# Patient Record
Sex: Female | Born: 1945 | Race: Black or African American | Hispanic: No | Marital: Married | Smoking: Former smoker
Health system: Southern US, Community
[De-identification: ages and names within clinical notes are randomized; demographics above are authoritative.]

## PROBLEM LIST (undated history)

## (undated) DIAGNOSIS — I10 Essential (primary) hypertension: Secondary | ICD-10-CM

## (undated) DIAGNOSIS — K219 Gastro-esophageal reflux disease without esophagitis: Secondary | ICD-10-CM

## (undated) DIAGNOSIS — R51 Headache: Secondary | ICD-10-CM

## (undated) DIAGNOSIS — M1990 Unspecified osteoarthritis, unspecified site: Secondary | ICD-10-CM

## (undated) DIAGNOSIS — M109 Gout, unspecified: Secondary | ICD-10-CM

## (undated) DIAGNOSIS — E785 Hyperlipidemia, unspecified: Secondary | ICD-10-CM

## (undated) DIAGNOSIS — R519 Headache, unspecified: Secondary | ICD-10-CM

## (undated) DIAGNOSIS — E119 Type 2 diabetes mellitus without complications: Secondary | ICD-10-CM

## (undated) DIAGNOSIS — N189 Chronic kidney disease, unspecified: Secondary | ICD-10-CM

## (undated) HISTORY — DX: Chronic kidney disease, unspecified: N18.9

## (undated) HISTORY — PX: NO PAST SURGERIES: SHX2092

## (undated) HISTORY — PX: TUBAL LIGATION: SHX77

## (undated) HISTORY — DX: Essential (primary) hypertension: I10

## (undated) HISTORY — DX: Gout, unspecified: M10.9

## (undated) HISTORY — DX: Hyperlipidemia, unspecified: E78.5

## (undated) HISTORY — DX: Type 2 diabetes mellitus without complications: E11.9

---

## 2011-10-07 ENCOUNTER — Other Ambulatory Visit: Payer: Self-pay | Admitting: Medical

## 2015-10-24 LAB — HEMOGLOBIN A1C: HEMOGLOBIN A1C: 7.1 % — AB (ref 4.0–6.0)

## 2015-11-02 ENCOUNTER — Ambulatory Visit (INDEPENDENT_AMBULATORY_CARE_PROVIDER_SITE_OTHER): Payer: Medicare Other | Admitting: "Endocrinology

## 2015-11-02 ENCOUNTER — Encounter: Payer: Self-pay | Admitting: "Endocrinology

## 2015-11-02 VITALS — BP 129/77 | HR 86 | Ht 67.0 in | Wt 212.0 lb

## 2015-11-02 DIAGNOSIS — I10 Essential (primary) hypertension: Secondary | ICD-10-CM | POA: Insufficient documentation

## 2015-11-02 DIAGNOSIS — E1122 Type 2 diabetes mellitus with diabetic chronic kidney disease: Secondary | ICD-10-CM

## 2015-11-02 DIAGNOSIS — N181 Chronic kidney disease, stage 1: Secondary | ICD-10-CM

## 2015-11-02 DIAGNOSIS — E785 Hyperlipidemia, unspecified: Secondary | ICD-10-CM | POA: Diagnosis not present

## 2015-11-02 NOTE — Patient Instructions (Signed)

## 2015-11-02 NOTE — Progress Notes (Signed)
Subjective:    Patient ID: Anita Lewis, female    DOB: 1946/11/24,    Past Medical History  Diagnosis Date  . Diabetes mellitus, type II (HCC)   . Hypertension   . Gout    History reviewed. No pertinent past surgical history. Social History   Social History  . Marital Status: Married    Spouse Name: N/A  . Number of Children: N/A  . Years of Education: N/A   Social History Main Topics  . Smoking status: Former Games developer  . Smokeless tobacco: Never Used  . Alcohol Use: No  . Drug Use: No  . Sexual Activity: Not Asked   Other Topics Concern  . None   Social History Narrative  . None   Outpatient Encounter Prescriptions as of 11/02/2015  Medication Sig  . aspirin 81 MG tablet Take 81 mg by mouth daily.  . insulin glargine (LANTUS) 100 UNIT/ML injection Inject 30 Units into the skin at bedtime.  Marland Kitchen lisinopril-hydrochlorothiazide (PRINZIDE,ZESTORETIC) 10-12.5 MG tablet Take 1 tablet by mouth daily.  . metFORMIN (GLUCOPHAGE) 500 MG tablet Take by mouth 2 (two) times daily with a meal.  . metoprolol tartrate (LOPRESSOR) 25 MG tablet Take 25 mg by mouth 2 (two) times daily.  . Multiple Vitamin (MULTIVITAMIN) tablet Take 1 tablet by mouth daily.   No facility-administered encounter medications on file as of 11/02/2015.   ALLERGIES: No Known Allergies VACCINATION STATUS:  There is no immunization history on file for this patient.  Diabetes She presents for her follow-up diabetic visit. She has type 2 diabetes mellitus. Onset time: She was diagnosed at approximate age of 74 years. Her disease course has been improving. There are no hypoglycemic associated symptoms. Pertinent negatives for hypoglycemia include no confusion, headaches, pallor or seizures. There are no diabetic associated symptoms. Pertinent negatives for diabetes include no chest pain, no fatigue, no polydipsia, no polyphagia and no polyuria. There are no hypoglycemic complications. Symptoms are improving.  There are no diabetic complications. Risk factors for coronary artery disease include diabetes mellitus and hypertension. She is compliant with treatment most of the time. Her weight is stable. She is following a diabetic diet. She has had a previous visit with a dietitian. She participates in exercise intermittently. Her home blood glucose trend is decreasing steadily. Her overall blood glucose range is 140-180 mg/dl. An ACE inhibitor/angiotensin II receptor blocker is being taken.  Hypertension This is a chronic problem. The current episode started more than 1 year ago. The problem is controlled. Pertinent negatives include no chest pain, headaches, palpitations or shortness of breath. Past treatments include ACE inhibitors. The current treatment provides moderate improvement. There are no compliance problems.   Hyperlipidemia This is a chronic problem. The current episode started more than 1 year ago. Pertinent negatives include no chest pain, myalgias or shortness of breath. Current antihyperlipidemic treatment includes statins.     Review of Systems  Constitutional: Negative for fatigue and unexpected weight change.  HENT: Negative for trouble swallowing and voice change.   Eyes: Negative for visual disturbance.  Respiratory: Negative for cough, shortness of breath and wheezing.   Cardiovascular: Negative for chest pain, palpitations and leg swelling.  Gastrointestinal: Negative for nausea, vomiting and diarrhea.  Endocrine: Negative for cold intolerance, heat intolerance, polydipsia, polyphagia and polyuria.  Musculoskeletal: Negative for myalgias and arthralgias.  Skin: Negative for color change, pallor, rash and wound.  Neurological: Negative for seizures and headaches.  Psychiatric/Behavioral: Negative for suicidal ideas and confusion.  Objective:    BP 129/77 mmHg  Pulse 86  Ht 5\' 7"  (1.702 m)  Wt 212 lb (96.163 kg)  BMI 33.20 kg/m2  SpO2 98%  Wt Readings from Last 3  Encounters:  11/02/15 212 lb (96.163 kg)    Physical Exam  Constitutional: She is oriented to person, place, and time. She appears well-developed.  HENT:  Head: Normocephalic and atraumatic.  Eyes: EOM are normal.  Neck: Normal range of motion. Neck supple. No tracheal deviation present. No thyromegaly present.  Cardiovascular: Normal rate and regular rhythm.   Pulmonary/Chest: Effort normal and breath sounds normal.  Abdominal: Soft. Bowel sounds are normal. There is no tenderness. There is no guarding.  Musculoskeletal: Normal range of motion. She exhibits no edema.  Neurological: She is alert and oriented to person, place, and time. She has normal reflexes. No cranial nerve deficit. Coordination normal.  Skin: Skin is warm and dry. No rash noted. No erythema. No pallor.  Psychiatric: She has a normal mood and affect. Judgment normal.       Assessment & Plan:   1. 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 These are all discussed in detail with the patient.  Patient came with improved glucose profile, and  recent A1c of 7.1 %.  Glucose logs and insulin administration records pertaining to this visit,  to be scanned into patient's records.  Recent labs reviewed.   - I have re-counseled the patient on diet management and weight loss  by adopting a carbohydrate restricted / protein rich  Diet.  - Suggestion is made for patient to avoid simple carbohydrates   from their diet including Cakes , Desserts, Ice Cream,  Soda (  diet and regular) , Sweet Tea , Candies,  Chips, Cookies, Artificial Sweeteners,   and "Sugar-free" Products .  This will help patient to have stable blood glucose profile and potentially avoid unintended  Weight gain.  - Patient is advised to stick to a routine mealtimes to eat 3 meals  a day and avoid  unnecessary snacks ( to snack only to correct hypoglycemia).  - The patient  has been  scheduled with 13/03/16, RDN, CDE for individualized DM education.  - I have approached patient with the following individualized plan to manage diabetes and patient agrees.  - I will proceed with basal insulin Lantus 30 units QHS,  associated with strict monitoring of glucose  Every morning. -Based on her readings she would not need prandial insulin for now.  - Patient is warned not to take insulin without proper monitoring per orders.  -Patient is encouraged to call clinic for blood glucose levels less than 70 or above 300 mg /dl. - I will continMetformin 500 mg by mouth twice a dayerapeutically suitable for patient..  - Patient will be considered for incretin therapy as appropriate next visit. - Patient specific target  for A1c; LDL, HDL, Triglycerides, and  Waist Circumference were discussed in detail.  2) BP/HTN: controllednue current medications including  ACEI/ARB. 3) Lipids/HPL:  continue statins. 4)  Weight/Diet: CDE consult in progress, exercise, and carbohydrates information provided.  5) Chronic Care/Health Maintenance:  -Patient is on ACEI/ARB and Statin medications and encouraged to continue to follow up with Ophthalmology, Podiatrist at least yearly or according to recommendations, and advised to  stay away from smoking. I have recommended yearly flu  vaccine and pneumonia vaccination at least every 5 years; moderate intensity exercise for up to 150 minutes weekly; and  sleep for at least 7 hours a day.  I advised patient to maintain close follow up with their PCP for primary care needs.  Patient is asked to bring meter and  blood glucose logs during their next visit.   Follow up plan: Return in about 3 months (around 02/02/2016) for diabetes, high blood pressure, high cholesterol.  Marquis Lunch, MD Phone: [phone]  Fax: [phone]   11/02/2015, 9:28 AM

## 2016-01-26 LAB — HEMOGLOBIN A1C: Hemoglobin A1C: 6.6

## 2016-02-08 ENCOUNTER — Ambulatory Visit (INDEPENDENT_AMBULATORY_CARE_PROVIDER_SITE_OTHER): Payer: Medicare Other | Admitting: "Endocrinology

## 2016-02-08 ENCOUNTER — Encounter: Payer: Self-pay | Admitting: "Endocrinology

## 2016-02-08 VITALS — BP 129/71 | HR 96 | Ht 67.0 in | Wt 211.0 lb

## 2016-02-08 DIAGNOSIS — E1122 Type 2 diabetes mellitus with diabetic chronic kidney disease: Secondary | ICD-10-CM

## 2016-02-08 DIAGNOSIS — N181 Chronic kidney disease, stage 1: Secondary | ICD-10-CM | POA: Diagnosis not present

## 2016-02-08 DIAGNOSIS — E785 Hyperlipidemia, unspecified: Secondary | ICD-10-CM | POA: Diagnosis not present

## 2016-02-08 DIAGNOSIS — I10 Essential (primary) hypertension: Secondary | ICD-10-CM | POA: Diagnosis not present

## 2016-02-08 MED ORDER — METFORMIN HCL 500 MG PO TABS: 500.0000 mg | ORAL_TABLET | Freq: Two times a day (BID) | ORAL | Status: DC

## 2016-02-08 NOTE — Patient Instructions (Signed)

## 2016-02-08 NOTE — Progress Notes (Signed)
Subjective:    Patient ID: Anita Lewis, female    DOB: 11-16-1946,    Past Medical History  Diagnosis Date  . Diabetes mellitus, type II (HCC)   . Hypertension   . Gout    History reviewed. No pertinent past surgical history. Social History   Social History  . Marital Status: Married    Spouse Name: N/A  . Number of Children: N/A  . Years of Education: N/A   Social History Main Topics  . Smoking status: Former Games developer  . Smokeless tobacco: Never Used  . Alcohol Use: No  . Drug Use: No  . Sexual Activity: Not Asked   Other Topics Concern  . None   Social History Narrative   Outpatient Encounter Prescriptions as of 02/08/2016  Medication Sig  . aspirin 81 MG tablet Take 81 mg by mouth daily.  . Insulin Glargine (LANTUS SOLOSTAR) 100 UNIT/ML Solostar Pen Inject 15 Units into the skin at bedtime.  Marland Kitchen lisinopril-hydrochlorothiazide (PRINZIDE,ZESTORETIC) 10-12.5 MG tablet Take 1 tablet by mouth daily.  . metFORMIN (GLUCOPHAGE) 500 MG tablet Take 1 tablet (500 mg total) by mouth 2 (two) times daily with a meal.  . metoprolol tartrate (LOPRESSOR) 25 MG tablet Take 25 mg by mouth 2 (two) times daily.  . Multiple Vitamin (MULTIVITAMIN) tablet Take 1 tablet by mouth daily.  . pravastatin (PRAVACHOL) 40 MG tablet Take 40 mg by mouth daily.  . [DISCONTINUED] metFORMIN (GLUCOPHAGE) 500 MG tablet Take by mouth 2 (two) times daily with a meal.  . [DISCONTINUED] insulin glargine (LANTUS) 100 UNIT/ML injection Inject 30 Units into the skin at bedtime.   No facility-administered encounter medications on file as of 02/08/2016.   ALLERGIES: No Known Allergies VACCINATION STATUS:  There is no immunization history on file for this patient.  Diabetes She presents for her follow-up diabetic visit. She has type 2 diabetes mellitus. Onset time: She was diagnosed at approximate age of 74 years. Her disease course has been improving. There are no hypoglycemic associated symptoms. Pertinent  negatives for hypoglycemia include no confusion, headaches, pallor or seizures. There are no diabetic associated symptoms. Pertinent negatives for diabetes include no chest pain, no fatigue, no polydipsia, no polyphagia and no polyuria. There are no hypoglycemic complications. Symptoms are improving. There are no diabetic complications. Risk factors for coronary artery disease include diabetes mellitus and hypertension. She is compliant with treatment most of the time. Her weight is stable. She is following a diabetic diet. She has had a previous visit with a dietitian. She participates in exercise intermittently. Her home blood glucose trend is decreasing steadily. Her breakfast blood glucose range is generally 140-180 mg/dl. Her overall blood glucose range is 140-180 mg/dl. An ACE inhibitor/angiotensin II receptor blocker is being taken.  Hypertension This is a chronic problem. The current episode started more than 1 year ago. The problem is controlled. Pertinent negatives include no chest pain, headaches, palpitations or shortness of breath. Past treatments include ACE inhibitors. The current treatment provides moderate improvement. There are no compliance problems.   Hyperlipidemia This is a chronic problem. The current episode started more than 1 year ago. Pertinent negatives include no chest pain, myalgias or shortness of breath. Current antihyperlipidemic treatment includes statins.     Review of Systems  Constitutional: Negative for fatigue and unexpected weight change.  HENT: Negative for trouble swallowing and voice change.   Eyes: Negative for visual disturbance.  Respiratory: Negative for cough, shortness of breath and wheezing.   Cardiovascular:  Negative for chest pain, palpitations and leg swelling.  Gastrointestinal: Negative for nausea, vomiting and diarrhea.  Endocrine: Negative for cold intolerance, heat intolerance, polydipsia, polyphagia and polyuria.  Musculoskeletal: Negative  for myalgias and arthralgias.  Skin: Negative for color change, pallor, rash and wound.  Neurological: Negative for seizures and headaches.  Psychiatric/Behavioral: Negative for suicidal ideas and confusion.    Objective:    BP 129/71 mmHg  Pulse 96  Ht 5\' 7"  (1.702 m)  Wt 211 lb (95.709 kg)  BMI 33.04 kg/m2  SpO2 96%  Wt Readings from Last 3 Encounters:  02/08/16 211 lb (95.709 kg)  11/02/15 212 lb (96.163 kg)    Physical Exam  Constitutional: She is oriented to person, place, and time. She appears well-developed.  HENT:  Head: Normocephalic and atraumatic.  Eyes: EOM are normal.  Neck: Normal range of motion. Neck supple. No tracheal deviation present. No thyromegaly present.  Cardiovascular: Normal rate and regular rhythm.   Pulmonary/Chest: Effort normal and breath sounds normal.  Abdominal: Soft. Bowel sounds are normal. There is no tenderness. There is no guarding.  Musculoskeletal: Normal range of motion. She exhibits no edema.  Neurological: She is alert and oriented to person, place, and time. She has normal reflexes. No cranial nerve deficit. Coordination normal.  Skin: Skin is warm and dry. No rash noted. No erythema. No pallor.  Psychiatric: She has a normal mood and affect. Judgment normal.   Recent Results (from the past 2160 hour(s))  Hemoglobin A1c     Status: None   Collection Time: 01/26/16 12:00 AM  Result Value Ref Range   Hemoglobin A1C 6.6      Assessment & Plan:   1. 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 These are all discussed in detail with the patient.  Patient came with improved glucose profile, and  recent A1c improved to 6.6% from 7.1 %.  Glucose logs and insulin administration records pertaining to this visit,  to be scanned into patient's records.  Recent labs reviewed.   - I  have re-counseled the patient on diet management and weight loss  by adopting a carbohydrate restricted / protein rich  Diet.  - Suggestion is made for patient to avoid simple carbohydrates   from their diet including Cakes , Desserts, Ice Cream,  Soda (  diet and regular) , Sweet Tea , Candies,  Chips, Cookies, Artificial Sweeteners,   and "Sugar-free" Products .  This will help patient to have stable blood glucose profile and potentially avoid unintended  Weight gain.  - Patient is advised to stick to a routine mealtimes to eat 3 meals  a day and avoid unnecessary snacks ( to snack only to correct hypoglycemia).  - The patient  has been  scheduled with 01/28/16, RDN, CDE for individualized DM education.  - I have approached patient with the following individualized plan to manage diabetes and patient agrees.  - I will lower Lantus   To 15 units QHS,  associated with strict monitoring of glucose  Every morning. -Based on her readings she would not need prandial insulin for now.   -Patient is encouraged to call clinic for blood glucose levels less than 70 or above 300 mg /dl. - I will continue Metformin 500 mg by mouth twice a dayerapeutically suitable for patient..  - Patient will be considered  for incretin therapy as appropriate next visit. - Patient specific target  for A1c; LDL, HDL, Triglycerides, and  Waist Circumference were discussed in detail.  2) BP/HTN: controllednue current medications including  ACEI/ARB. 3) Lipids/HPL:  continue statins. 4)  Weight/Diet: CDE consult in progress, exercise, and carbohydrates information provided.  5) Chronic Care/Health Maintenance:  -Patient is on ACEI/ARB and Statin medications and encouraged to continue to follow up with Ophthalmology, Podiatrist at least yearly or according to recommendations, and advised to  stay away from smoking. I have recommended yearly flu vaccine and pneumonia vaccination at least every 5 years; moderate  intensity exercise for up to 150 minutes weekly; and  sleep for at least 7 hours a day.  I advised patient to maintain close follow up with their PCP for primary care needs.  Patient is asked to bring meter and  blood glucose logs during their next visit.   Follow up plan: Return in about 3 months (around 05/07/2016) for diabetes, high blood pressure, high cholesterol, follow up with pre-visit labs, meter, and logs.  Marquis Lunch, MD Phone: [phone]  Fax: [phone]   02/08/2016, 9:33 AM

## 2016-02-16 ENCOUNTER — Other Ambulatory Visit: Payer: Self-pay | Admitting: "Endocrinology

## 2016-03-14 ENCOUNTER — Encounter: Payer: Medicare Other | Attending: "Endocrinology | Admitting: Nutrition

## 2016-03-14 ENCOUNTER — Encounter: Payer: Self-pay | Admitting: Nutrition

## 2016-03-14 VITALS — Ht 66.0 in | Wt 209.0 lb

## 2016-03-14 DIAGNOSIS — N181 Chronic kidney disease, stage 1: Secondary | ICD-10-CM | POA: Diagnosis not present

## 2016-03-14 DIAGNOSIS — E119 Type 2 diabetes mellitus without complications: Secondary | ICD-10-CM

## 2016-03-14 DIAGNOSIS — E1122 Type 2 diabetes mellitus with diabetic chronic kidney disease: Secondary | ICD-10-CM | POA: Diagnosis not present

## 2016-03-14 DIAGNOSIS — E669 Obesity, unspecified: Secondary | ICD-10-CM

## 2016-03-14 NOTE — Progress Notes (Signed)
  Medical Nutrition Therapy:  Appt start time: 0830 end time:  0930.  Assessment:  Primary concerns today: Diabetes Type 2. Most recent A1C 6.6%, down from 7.1%. 15 units Lantus, 500 mg of Metform twice a day. She cooks and shops. Eats 2-3 meals per day. Tends to skip meals often. Physical activity: housework but not exercising much. A1C improving. Denies any low blood sugars.  Diet is inconsistent in CHO, fat, protein and low in fresh fruits and low carb vegetables. Needs to cut out sweetened beverages and sherbet.. Seems to be motivated.  Preferred Learning Style:  Auditory  Visual  Hands on   Learning Readiness:   Ready  Change in progress   MEDICATIONS: See list   DIETARY INTAKE:   24-hr recall:  B ( AM): 1 boiled egg, 1 slice, coffee; sometimes skips Snk ( AM):  none  L ( PM): skipped , water Snk ( PM):  D ( PM): Chicken, potatoes, Koolaid, Lemonade not sugar free Snk ( PM): Sherbet Beverages: water, koolaild, lemonade  Usual physical activity: ADL  Estimated energy needs: 1500 calories 170 g carbohydrates 112 g protein 42 g fat  Progress Towards Goal(s):  In progress.   Nutritional Diagnosis:  NB-1.1 Food and nutrition-related knowledge deficit As related to DM.  As evidenced by A1`C 6.6%.    Intervention:  Nutrition and Diabetes education provided on My Plate, CHO counting, meal planning, portion sizes, timing of meals, avoiding snacks between meals unless having a low blood sugar, target ranges for A1C and blood sugars, signs/symptoms and treatment of hyper/hypoglycemia, monitoring blood sugars, taking medications as prescribed, benefits of exercising 30 minutes per day and prevention of complications of DM.  Goals: 1 Follow Plate Method 2. Cut out sherbet. Do not skip meals. 3. Increase low carb vegetables and fresh fruit. 4. Drink only water-no juice, soda, tea or smoothies 5. Exercise 30+ minutes 5 days per week. 6. Lose 1-2 lbs per week. 7. Only  eat vegetables for snacks. 8. Do not eat past 7 pm.  Teaching Method Utilized:  Visual Auditory Hands on  Handouts given during visit include:  The Plate Method  Meal Plan Card    Barriers to learning/adherence to lifestyle change: None  Demonstrated degree of understanding via:  Teach Back   Monitoring/Evaluation:  Dietary intake, exercise, meal planning, SBG, and body weight in 1 month(s).

## 2016-03-15 NOTE — Patient Instructions (Signed)
Goals: 1 Follow Plate Method 2. Cut out sherbet. 3. Increase low carb vegetables and fresh fruit. 4. Drink only water-no juice, soda, tea or smoothies 5. Exercise 30+ minutes 5 days per week. 6. Lose 1-2 lbs per week. 7. Only eat vegetables for snacks. 8. Do not eat past 7 pm.

## 2016-05-09 ENCOUNTER — Ambulatory Visit: Payer: Medicare Other | Admitting: Nutrition

## 2016-05-09 ENCOUNTER — Ambulatory Visit: Payer: Medicare Other | Admitting: "Endocrinology

## 2016-12-12 ENCOUNTER — Ambulatory Visit (INDEPENDENT_AMBULATORY_CARE_PROVIDER_SITE_OTHER): Payer: Medicare Other | Admitting: Orthopaedic Surgery

## 2016-12-19 ENCOUNTER — Ambulatory Visit (INDEPENDENT_AMBULATORY_CARE_PROVIDER_SITE_OTHER): Payer: Medicare Other

## 2016-12-19 ENCOUNTER — Encounter (INDEPENDENT_AMBULATORY_CARE_PROVIDER_SITE_OTHER): Payer: Self-pay | Admitting: Orthopaedic Surgery

## 2016-12-19 ENCOUNTER — Ambulatory Visit (INDEPENDENT_AMBULATORY_CARE_PROVIDER_SITE_OTHER): Payer: Medicare Other | Admitting: Orthopaedic Surgery

## 2016-12-19 VITALS — BP 121/72 | HR 80 | Ht 67.0 in | Wt 210.0 lb

## 2016-12-19 DIAGNOSIS — M1611 Unilateral primary osteoarthritis, right hip: Secondary | ICD-10-CM | POA: Insufficient documentation

## 2016-12-19 DIAGNOSIS — M5441 Lumbago with sciatica, right side: Secondary | ICD-10-CM

## 2016-12-19 DIAGNOSIS — M25551 Pain in right hip: Secondary | ICD-10-CM

## 2016-12-19 NOTE — Progress Notes (Signed)
Office Visit Note   Patient: Anita Lewis           Date of Birth: 10-27-1946           MRN: [id number] Visit Date: 12/19/2016              Requested by: Oley Balm. Margo Common, MD [address] [place], [place] [address] PCP: Louie Boston, MD   Assessment & Plan: Visit Diagnoses:  1. Primary osteoarthritis of right hip   2. Right-sided low back pain with right-sided sciatica, unspecified chronicity   3. Unilateral primary osteoarthritis, right hip     Plan: Patient has complete loss of joint space end-stage hip osteoarthritis. Several years ago she was told she needed total hip arthroplasty and has been discussed with her previously for her primary care physician Dr. Margo Common. We discussed using the direct anterior approach 1-2 night stay in the hospital ,immediately weightbearing after surgery as the usual protocol. Perioperative diabetic control discussed. A1c has been less than 7. Procedure discussed risks of surgery discussed. She can call if she would like to proceed with scheduling.  Follow-Up Instructions: No Follow-up on file.   Orders:  Orders Placed This Encounter  Procedures  . XR HIP UNILAT W OR W/O PELVIS 2-3 VIEWS RIGHT  . XR Lumbar Spine 2-3 Views   No orders of the defined types were placed in this encounter.     Procedures: No procedures performed   Clinical Data: No additional findings.   Subjective: Chief Complaint  Patient presents with  . Lower Back - Pain  . Right Leg - Pain    Patient presents with low back pain that radiates into the right hip and thigh.  She has had ESI 11/2015.  She has a history of disc bulges with a MRI being done 2015.  She states the pain has been ongoing x 2 years. It gets better, but then it gets worse again. She is taking Tylenol as needed for pain with no relief.  Patient relates that she has pain with weightbearing. When she walks she notices that she lurches to the right. Pain is in the anterior right groin radiates  down to the mid thigh. She gets relief with supine position. At the end of the day she has more pain difficulty sleeping. She has used a cane. She does not have a walker. She has taken aspirin and also Tylenol for pain without relief. She denies any pain that radiates down past the mid thigh region. No numbness or tingling or weakness in her foot. She is on Lantus insulin 15 units at night and also takes metformin.  Review of Systems  Constitutional: Negative for chills and diaphoresis.  HENT: Negative for ear discharge, ear pain and nosebleeds.   Eyes: Negative for discharge and visual disturbance.  Respiratory: Negative for cough, choking and shortness of breath.   Cardiovascular: Negative for chest pain and palpitations.  Gastrointestinal: Negative for abdominal distention and abdominal pain.  Endocrine: Negative for cold intolerance and heat intolerance.       Positive for type 2 diabetes on Lantus insulin and metformin  Genitourinary: Negative for flank pain and hematuria.  Musculoskeletal:       Previous trigger finger surgery last year doing well. Past history of slight L5-S1 disc bulge without severe compression. She's had epidural steroid injections and previous years. L5-S1 disc space narrowing on plain radiographs. Hip x-rays demonstrate progressive hip arthritis now severe.  Skin: Negative for rash and wound.  Neurological: Negative for seizures and speech difficulty.  Hematological: Negative for adenopathy. Does not bruise/bleed easily.  Psychiatric/Behavioral: Negative for agitation and suicidal ideas.     Objective: Vital Signs: BP 121/72   Pulse 80   Ht 5\' 7"  (1.702 m)   Wt 210 lb (95.3 kg)   BMI 32.89 kg/m   Physical Exam  Constitutional: She is oriented to person, place, and time. She appears well-developed and well-nourished.  HENT:  Head: Normocephalic.  Right Ear: External ear normal.  Left Ear: External ear normal.  Eyes: Pupils are equal, round, and reactive  to light.  Neck: No tracheal deviation present. No thyromegaly present.  Cardiovascular: Normal rate.   Pulmonary/Chest: Effort normal.  Abdominal: Soft. She exhibits no distension. There is no tenderness.  Musculoskeletal:  Patient cannot figure for the right side can do it easily on the left. Internal rotation of the right hip is only 10 with severe groin pain reproducing her pain with weightbearing. Positive Trendelenburg gait on the right negative on the left. Negative straight leg raising 90 knee and ankle jerk are 2+ anterior tib EHL are strong quad strength is good no adductor or abductor weakness.  Neurological: She is alert and oriented to person, place, and time.  Skin: Skin is warm and dry.  Psychiatric: She has a normal mood and affect. Her behavior is normal.    Ortho Exam patient does not have hip flexion contracture. Positive logroll on the right only in both the flexion and extension of the hip. Leg lengths are equal bilateral trochanteric bursal tenderness mild sciatic notch tenderness. Negative reverse straight leg raising. Anterior tib EHL gastrocsoleus are all normal to motor testing. Distal pulses are intact.  Specialty Comments:  No specialty comments available.  Imaging: Xr Hip Unilat W Or W/o Pelvis 2-3 Views Right  Result Date: 12/19/2016 Standing AP pelvis and AP and frog-leg x-ray right hip are obtained and reviewed. This shows unilateral hip osteoarthritis of the right hip loss of joint space and flattening of the head marginal osteophytes subchondral sclerosis and subchondral cyst formation. Impression: End-stage right hip osteoarthritis  Xr Lumbar Spine 2-3 Views  Result Date: 12/19/2016 AP and lateral lumbar x-rays obtained. Lumbar spine film shows slight calcification of the of the abdominal aorta. 80% loss of disc space height at L5-S1. Facet degenerative changes seen at L5-S1 on AP x-ray and severe right hip osteoarthritis. Impression L5-S1 disks  narrowing without spondylolisthesis.    PMFS History: Patient Active Problem List   Diagnosis Date Noted  . Unilateral primary osteoarthritis, right hip 12/19/2016  . 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 Essential hypertension, benign 11/02/2015  . Hyperlipidemia 11/02/2015  . Morbid obesity due to excess calories (HCC) 11/02/2015   Past Medical History:  Diagnosis Date  . Diabetes mellitus, type II (HCC)   . Gout   . Hypertension     Family History  Problem Relation Age of Onset  . Cancer Mother     lung  . Hypertension Mother   . Hypertension Father     No past surgical history on file. Social History   Occupational History  . Not on file.   Social History Main Topics  . Smoking status: Former 13/02/2015  . Smokeless tobacco: Never Used  . Alcohol use No  . Drug use: No  . Sexual activity: Not on file

## 2017-01-24 ENCOUNTER — Other Ambulatory Visit (INDEPENDENT_AMBULATORY_CARE_PROVIDER_SITE_OTHER): Payer: Self-pay | Admitting: Orthopaedic Surgery

## 2017-01-24 DIAGNOSIS — M1611 Unilateral primary osteoarthritis, right hip: Secondary | ICD-10-CM

## 2017-02-07 NOTE — Pre-Procedure Instructions (Signed)
HYDEIA MCATEE  02/07/2017      [facility] [address], [place] - [address] [place] [address] Phone: [phone] Fax: [phone]  [facility] - [place], [place] - [address] [place] [address] Phone: [phone] Fax: [phone]    Your procedure is scheduled on 02/17/17.  Report to [facility] Admitting at 1030 A.M.  Call this number if you have problems the morning of surgery:  [phone]   Remember:  Do not eat food or drink liquids after midnight.  Take these medicines the morning of surgery with A SIP OF WATER --tylenol,metoprolol   Do not wear jewelry, make-up or nail polish.  Do not wear lotions, powders, or perfumes, or deoderant.  Do not shave 48 hours prior to surgery.  Men may shave face and neck.  Do not bring valuables to the hospital.  [facility] is not responsible for any belongings or valuables.  Contacts, dentures or bridgework may not be worn into surgery.  Leave your suitcase in the car.  After surgery it may be brought to your room.  For patients admitted to the hospital, discharge time will be determined by your treatment team.  Patients discharged the day of surgery will not be allowed to drive home.   Name and phone number of your driver:    Special instructions:  Do not take any aspirin,anti-inflammatories,vitamins,or herbal supplements 5-7 days prior to surgery.  Please read over the following fact sheets that you were given. MRSA Information    How to Manage Your Diabetes Before and After Surgery  Why is it important to control my blood sugar before and after surgery? . Improving blood sugar levels before and after surgery helps healing and can limit problems. . A way of improving blood sugar control is eating a healthy diet by: o  Eating less sugar and carbohydrates o  Increasing activity/exercise o  Talking with your doctor about reaching your blood  sugar goals . High blood sugars (greater than 180 mg/dL) can raise your risk of infections and slow your recovery, so you will need to focus on controlling your diabetes during the weeks before surgery. . Make sure that the doctor who takes care of your diabetes knows about your planned surgery including the date and location.  How do I manage my blood sugar before surgery? . Check your blood sugar at least 4 times a day, starting 2 days before surgery, to make sure that the level is not too high or low. o Check your blood sugar the morning of your surgery when you wake up and every 2 hours until you get to the Short Stay unit. . If your blood sugar is less than 70 mg/dL, you will need to treat for low blood sugar: o Do not take insulin. o Treat a low blood sugar (less than 70 mg/dL) with  cup of clear juice (cranberry or apple), 4 glucose tablets, OR glucose gel. o Recheck blood sugar in 15 minutes after treatment (to make sure it is greater than 70 mg/dL). If your blood sugar is not greater than 70 mg/dL on recheck, call [phone] for further instructions. . Report your blood sugar to the short stay nurse when you get to Short Stay.  . If you are admitted to the hospital after surgery: o Your blood sugar will be checked by the staff and you will probably be given insulin after  surgery (instead of oral diabetes medicines) to make sure you have good blood sugar levels. o The goal for blood sugar control after surgery is 80-180 mg/dL.              WHAT DO I DO ABOUT MY DIABETES MEDICATION?   Marland Kitchen Do not take oral diabetes medicines (pills) the morning of surgery.  . THE NIGHT BEFORE SURGERY, take ___________ units of ___________insulin.       Marland Kitchen HE MORNING OF SURGERY, take _____________ units of __________insulin.  . The day of surgery, do not take other diabetes injectables, including Byetta (exenatide), Bydureon (exenatide ER), Victoza (liraglutide), or Trulicity  (dulaglutide).  . If your CBG is greater than 220 mg/dL, you may take  of your sliding scale (correction) dose of insulin.  Other Instructions:          Patient Signature:  Date:   Nurse Signature:  Date:   Reviewed and Endorsed by [facility] Patient Education Committee, August 2015

## 2017-02-10 ENCOUNTER — Encounter (HOSPITAL_COMMUNITY): Payer: Self-pay

## 2017-02-10 ENCOUNTER — Other Ambulatory Visit (HOSPITAL_COMMUNITY): Payer: PRIVATE HEALTH INSURANCE

## 2017-02-10 ENCOUNTER — Encounter (HOSPITAL_COMMUNITY)
Admission: RE | Admit: 2017-02-10 | Discharge: 2017-02-10 | Disposition: A | Discharge status: Home / Self Care | Payer: Medicare Other | Source: Ambulatory Visit | Attending: Orthopaedic Surgery | Admitting: Orthopaedic Surgery

## 2017-02-10 DIAGNOSIS — I10 Essential (primary) hypertension: Secondary | ICD-10-CM | POA: Insufficient documentation

## 2017-02-10 DIAGNOSIS — Z01810 Encounter for preprocedural cardiovascular examination: Secondary | ICD-10-CM | POA: Diagnosis present

## 2017-02-10 DIAGNOSIS — Z01812 Encounter for preprocedural laboratory examination: Secondary | ICD-10-CM | POA: Insufficient documentation

## 2017-02-10 DIAGNOSIS — E119 Type 2 diabetes mellitus without complications: Secondary | ICD-10-CM | POA: Diagnosis not present

## 2017-02-10 HISTORY — DX: Headache: R51

## 2017-02-10 HISTORY — DX: Unspecified osteoarthritis, unspecified site: M19.90

## 2017-02-10 HISTORY — DX: Headache, unspecified: R51.9

## 2017-02-10 LAB — URINALYSIS, ROUTINE W REFLEX MICROSCOPIC
BILIRUBIN URINE: NEGATIVE
Glucose, UA: NEGATIVE mg/dL
Hgb urine dipstick: NEGATIVE
KETONES UR: NEGATIVE mg/dL
NITRITE: NEGATIVE
Protein, ur: NEGATIVE mg/dL
SPECIFIC GRAVITY, URINE: 1.012 (ref 1.005–1.030)
pH: 7 (ref 5.0–8.0)

## 2017-02-10 LAB — COMPREHENSIVE METABOLIC PANEL
ALBUMIN: 3.6 g/dL (ref 3.5–5.0)
ALT: 7 U/L — AB (ref 14–54)
AST: 14 U/L — ABNORMAL LOW (ref 15–41)
Alkaline Phosphatase: 56 U/L (ref 38–126)
Anion gap: 9 (ref 5–15)
BUN: 20 mg/dL (ref 6–20)
CHLORIDE: 106 mmol/L (ref 101–111)
CO2: 25 mmol/L (ref 22–32)
CREATININE: 1.34 mg/dL — AB (ref 0.44–1.00)
Calcium: 9.4 mg/dL (ref 8.9–10.3)
GFR calc non Af Amer: 39 mL/min — ABNORMAL LOW (ref >60)
GFR, EST AFRICAN AMERICAN: 46 mL/min — AB (ref >60)
GLUCOSE: 102 mg/dL — AB (ref 65–99)
Potassium: 4 mmol/L (ref 3.5–5.1)
Sodium: 140 mmol/L (ref 135–145)
Total Bilirubin: 0.8 mg/dL (ref 0.3–1.2)
Total Protein: 7.2 g/dL (ref 6.5–8.1)

## 2017-02-10 LAB — CBC
HCT: 35.6 % — ABNORMAL LOW (ref 36.0–46.0)
HEMOGLOBIN: 10.8 g/dL — AB (ref 12.0–15.0)
MCH: 24.9 pg — AB (ref 26.0–34.0)
MCHC: 30.3 g/dL (ref 30.0–36.0)
MCV: 82 fL (ref 78.0–100.0)
PLATELETS: 268 10*3/uL (ref 150–400)
RBC: 4.34 MIL/uL (ref 3.87–5.11)
RDW: 16.5 % — ABNORMAL HIGH (ref 11.5–15.5)
WBC: 9.3 10*3/uL (ref 4.0–10.5)

## 2017-02-10 LAB — SURGICAL PCR SCREEN
MRSA, PCR: NEGATIVE
STAPHYLOCOCCUS AUREUS: NEGATIVE

## 2017-02-10 LAB — GLUCOSE, CAPILLARY: GLUCOSE-CAPILLARY: 90 mg/dL (ref 65–99)

## 2017-02-11 ENCOUNTER — Other Ambulatory Visit (INDEPENDENT_AMBULATORY_CARE_PROVIDER_SITE_OTHER): Payer: Self-pay | Admitting: Orthopaedic Surgery

## 2017-02-11 LAB — HEMOGLOBIN A1C
HEMOGLOBIN A1C: 6.8 % — AB (ref 4.8–5.6)
MEAN PLASMA GLUCOSE: 148 mg/dL

## 2017-02-11 NOTE — Progress Notes (Signed)
Anesthesia Chart Review: Patient is a 71 year old female scheduled for right THA on 02/17/17 by Dr. Ophelia Charter.  History includes former smoker, diabetes mellitus type 2, hypertension, gout, arthritis, headaches, tubal ligation. BMI is consistent with obesity.  PCP is Dr. Wyvonnia Lora at [facility]. Endocrinologist is Dr. Fransico Him.  BP (!) 158/81   Pulse 91   Temp 36.5 C   Resp 20   Ht 5\' 7"  (1.702 m)   Wt 204 lb 11.2 oz (92.9 kg)   SpO2 100%   BMI 32.06 kg/m   Meds include aspirin 81 mg, vitamin D, Lantus, lisinopril-HCTZ, metformin, Lopressor.  EKG to 1218: Normal sinus rhythm, cannot rule out anterior infarct, age undetermined. Nonspecific ST-T wave changes. No old tracing to compare in [place] or [place].  Preoperative labs noted. Creatinine 1.34 (BUN 24, Cr 1.24 on 10/24/15; scanned under Media tab from [facility] [place]). H&H 10.8 and 35.6. Glucose 102. A1c 6.8. UA showed leukocytes but negative nitrites, many bacteria, 0-5 WBCs. Dr. Grove has reviewed UA results and his advise her to go to her PCP as soon as possible for repeat UA and culture and treatment.  Patient to be seen at her PCP office for UA on 02/12/17. PAT labs/EKG forwarded to Dr. 02/14/17. If no acute changes then I anticipate that she can proceed as planned from an anesthesia standpoint.  Margo Common [facility] Short Stay Center/Anesthesiology Phone [phone] 02/11/2017 5:27 PM

## 2017-02-11 NOTE — Progress Notes (Signed)
Discussed this patient with Zonia Kief, PA-C.  He called patient's PCP to advise of her pre-op labs.  They will work her in for an appointment tomorrow.  Labs were sent to her PCP to review.  I called patient to advise, she expressed understanding of the situation.

## 2017-02-12 ENCOUNTER — Telehealth (INDEPENDENT_AMBULATORY_CARE_PROVIDER_SITE_OTHER): Payer: Self-pay | Admitting: Surgery

## 2017-02-17 ENCOUNTER — Inpatient Hospital Stay (HOSPITAL_COMMUNITY): Payer: Medicare Other | Admitting: Vascular Surgery

## 2017-02-17 ENCOUNTER — Inpatient Hospital Stay (HOSPITAL_COMMUNITY): Payer: Medicare Other

## 2017-02-17 ENCOUNTER — Inpatient Hospital Stay (HOSPITAL_COMMUNITY): Payer: Medicare Other | Admitting: Anesthesiology

## 2017-02-17 ENCOUNTER — Encounter (HOSPITAL_COMMUNITY)
Admission: RE | Disposition: A | Discharge status: Home Health | Payer: Self-pay | Source: Ambulatory Visit | Attending: Orthopaedic Surgery

## 2017-02-17 ENCOUNTER — Inpatient Hospital Stay (HOSPITAL_COMMUNITY)
Admission: RE | Admit: 2017-02-17 | Discharge: 2017-02-19 | DRG: 470 | Disposition: A | Discharge status: Home Health | Payer: Medicare Other | Source: Ambulatory Visit | Attending: Orthopaedic Surgery | Admitting: Orthopaedic Surgery

## 2017-02-17 ENCOUNTER — Encounter (HOSPITAL_COMMUNITY): Payer: Self-pay | Admitting: *Deleted

## 2017-02-17 DIAGNOSIS — Z6832 Body mass index (BMI) 32.0-32.9, adult: Secondary | ICD-10-CM | POA: Diagnosis not present

## 2017-02-17 DIAGNOSIS — Z8249 Family history of ischemic heart disease and other diseases of the circulatory system: Secondary | ICD-10-CM

## 2017-02-17 DIAGNOSIS — Z87891 Personal history of nicotine dependence: Secondary | ICD-10-CM

## 2017-02-17 DIAGNOSIS — I129 Hypertensive chronic kidney disease with stage 1 through stage 4 chronic kidney disease, or unspecified chronic kidney disease: Secondary | ICD-10-CM | POA: Diagnosis present

## 2017-02-17 DIAGNOSIS — E669 Obesity, unspecified: Secondary | ICD-10-CM | POA: Diagnosis present

## 2017-02-17 DIAGNOSIS — E1122 Type 2 diabetes mellitus with diabetic chronic kidney disease: Secondary | ICD-10-CM | POA: Diagnosis present

## 2017-02-17 DIAGNOSIS — Z09 Encounter for follow-up examination after completed treatment for conditions other than malignant neoplasm: Secondary | ICD-10-CM

## 2017-02-17 DIAGNOSIS — Z419 Encounter for procedure for purposes other than remedying health state, unspecified: Secondary | ICD-10-CM

## 2017-02-17 DIAGNOSIS — N181 Chronic kidney disease, stage 1: Secondary | ICD-10-CM | POA: Diagnosis present

## 2017-02-17 DIAGNOSIS — E785 Hyperlipidemia, unspecified: Secondary | ICD-10-CM | POA: Diagnosis present

## 2017-02-17 DIAGNOSIS — M25551 Pain in right hip: Secondary | ICD-10-CM | POA: Diagnosis present

## 2017-02-17 DIAGNOSIS — M1611 Unilateral primary osteoarthritis, right hip: Secondary | ICD-10-CM | POA: Diagnosis present

## 2017-02-17 DIAGNOSIS — Z794 Long term (current) use of insulin: Secondary | ICD-10-CM

## 2017-02-17 HISTORY — PX: TOTAL HIP ARTHROPLASTY: SHX124

## 2017-02-17 LAB — HEMOGLOBIN AND HEMATOCRIT, BLOOD
HCT: 38.3 % (ref 36.0–46.0)
HEMOGLOBIN: 11.6 g/dL — AB (ref 12.0–15.0)

## 2017-02-17 LAB — URINALYSIS, ROUTINE W REFLEX MICROSCOPIC
BILIRUBIN URINE: NEGATIVE
GLUCOSE, UA: NEGATIVE mg/dL
HGB URINE DIPSTICK: NEGATIVE
Ketones, ur: NEGATIVE mg/dL
NITRITE: NEGATIVE
PROTEIN: NEGATIVE mg/dL
Specific Gravity, Urine: 1.016 (ref 1.005–1.030)
pH: 5 (ref 5.0–8.0)

## 2017-02-17 LAB — GLUCOSE, CAPILLARY
GLUCOSE-CAPILLARY: 157 mg/dL — AB (ref 65–99)
Glucose-Capillary: 103 mg/dL — ABNORMAL HIGH (ref 65–99)
Glucose-Capillary: 135 mg/dL — ABNORMAL HIGH (ref 65–99)

## 2017-02-17 SURGERY — ARTHROPLASTY, HIP, TOTAL, ANTERIOR APPROACH
Anesthesia: Spinal | Laterality: Right

## 2017-02-17 MED ORDER — ONDANSETRON HCL 4 MG/2ML IJ SOLN
INTRAMUSCULAR | Status: DC | PRN
  Administered 2017-02-17: 4 mg via INTRAVENOUS

## 2017-02-17 MED ORDER — MIDAZOLAM HCL 2 MG/2ML IJ SOLN
INTRAMUSCULAR | Status: AC
  Filled 2017-02-17: qty 2

## 2017-02-17 MED ORDER — PHENOL 1.4 % MT LIQD: 1.0000 | OROMUCOSAL | Status: DC | PRN

## 2017-02-17 MED ORDER — METHOCARBAMOL 500 MG PO TABS
500.0000 mg | ORAL_TABLET | Freq: Four times a day (QID) | ORAL | Status: DC | PRN
  Filled 2017-02-17: qty 1

## 2017-02-17 MED ORDER — POLYETHYLENE GLYCOL 3350 17 G PO PACK: 17.0000 g | PACK | Freq: Every day | ORAL | Status: DC | PRN

## 2017-02-17 MED ORDER — FENTANYL CITRATE (PF) 100 MCG/2ML IJ SOLN
INTRAMUSCULAR | Status: DC | PRN
  Administered 2017-02-17 (×2): 50 ug via INTRAVENOUS

## 2017-02-17 MED ORDER — BUPIVACAINE IN DEXTROSE 0.75-8.25 % IT SOLN
INTRATHECAL | Status: DC | PRN
  Administered 2017-02-17: 12 mg via INTRATHECAL

## 2017-02-17 MED ORDER — PHENYLEPHRINE HCL 10 MG/ML IJ SOLN
INTRAMUSCULAR | Status: DC | PRN
  Administered 2017-02-17 (×3): 40 ug via INTRAVENOUS
  Administered 2017-02-17: 80 ug via INTRAVENOUS
  Administered 2017-02-17: 40 ug via INTRAVENOUS

## 2017-02-17 MED ORDER — PROMETHAZINE HCL 25 MG/ML IJ SOLN
6.2500 mg | INTRAMUSCULAR | Status: AC | PRN
Start: 2017-02-17 — End: 2017-02-17
  Administered 2017-02-17 (×2): 6.25 mg via INTRAVENOUS

## 2017-02-17 MED ORDER — METHOCARBAMOL 1000 MG/10ML IJ SOLN
500.0000 mg | Freq: Four times a day (QID) | INTRAVENOUS | Status: DC | PRN
  Filled 2017-02-17: qty 5

## 2017-02-17 MED ORDER — LACTATED RINGERS IV SOLN
Freq: Once | INTRAVENOUS | Status: AC
  Administered 2017-02-17: 11:00:00 via INTRAVENOUS

## 2017-02-17 MED ORDER — ASPIRIN EC 325 MG PO TBEC
325.0000 mg | DELAYED_RELEASE_TABLET | Freq: Every day | ORAL | Status: DC
  Administered 2017-02-18 – 2017-02-19 (×2): 325 mg via ORAL
  Filled 2017-02-17 (×2): qty 1

## 2017-02-17 MED ORDER — METOCLOPRAMIDE HCL 5 MG/ML IJ SOLN: 5.0000 mg | Freq: Three times a day (TID) | INTRAMUSCULAR | Status: DC | PRN

## 2017-02-17 MED ORDER — CHLORHEXIDINE GLUCONATE 4 % EX LIQD: 60.0000 mL | Freq: Once | CUTANEOUS | Status: DC

## 2017-02-17 MED ORDER — ONDANSETRON HCL 4 MG/2ML IJ SOLN
4.0000 mg | Freq: Four times a day (QID) | INTRAMUSCULAR | Status: DC | PRN
  Administered 2017-02-18: 4 mg via INTRAVENOUS
  Filled 2017-02-17: qty 2

## 2017-02-17 MED ORDER — PHENYLEPHRINE 40 MCG/ML (10ML) SYRINGE FOR IV PUSH (FOR BLOOD PRESSURE SUPPORT)
PREFILLED_SYRINGE | INTRAVENOUS | Status: AC
  Filled 2017-02-17: qty 10

## 2017-02-17 MED ORDER — OXYCODONE-ACETAMINOPHEN 5-325 MG PO TABS
1.0000 | ORAL_TABLET | ORAL | Status: DC | PRN
  Administered 2017-02-17 – 2017-02-18 (×5): 2 via ORAL
  Filled 2017-02-17 (×7): qty 2

## 2017-02-17 MED ORDER — INSULIN GLARGINE 100 UNIT/ML ~~LOC~~ SOLN
20.0000 [IU] | Freq: Every day | SUBCUTANEOUS | Status: DC
  Administered 2017-02-17 – 2017-02-18 (×2): 20 [IU] via SUBCUTANEOUS
  Filled 2017-02-17 (×3): qty 0.2

## 2017-02-17 MED ORDER — ONDANSETRON HCL 4 MG PO TABS
4.0000 mg | ORAL_TABLET | Freq: Four times a day (QID) | ORAL | Status: DC | PRN
  Administered 2017-02-19: 4 mg via ORAL
  Filled 2017-02-17: qty 1

## 2017-02-17 MED ORDER — EPINEPHRINE PF 1 MG/ML IJ SOLN
INTRAMUSCULAR | Status: AC
  Filled 2017-02-17: qty 1

## 2017-02-17 MED ORDER — HYDROMORPHONE HCL 1 MG/ML IJ SOLN
0.2500 mg | INTRAMUSCULAR | Status: DC | PRN
  Administered 2017-02-17 (×3): 0.5 mg via INTRAVENOUS

## 2017-02-17 MED ORDER — PROPOFOL 10 MG/ML IV BOLUS
INTRAVENOUS | Status: AC
  Filled 2017-02-17: qty 20

## 2017-02-17 MED ORDER — MENTHOL 3 MG MT LOZG: 1.0000 | LOZENGE | OROMUCOSAL | Status: DC | PRN

## 2017-02-17 MED ORDER — SODIUM CHLORIDE 0.9 % IV SOLN
INTRAVENOUS | Status: DC
  Administered 2017-02-17: 17:00:00 via INTRAVENOUS

## 2017-02-17 MED ORDER — METOCLOPRAMIDE HCL 5 MG PO TABS: 5.0000 mg | ORAL_TABLET | Freq: Three times a day (TID) | ORAL | Status: DC | PRN

## 2017-02-17 MED ORDER — DOCUSATE SODIUM 100 MG PO CAPS
100.0000 mg | ORAL_CAPSULE | Freq: Two times a day (BID) | ORAL | Status: DC
  Administered 2017-02-17 – 2017-02-19 (×4): 100 mg via ORAL
  Filled 2017-02-17 (×4): qty 1

## 2017-02-17 MED ORDER — HYDROMORPHONE HCL 1 MG/ML IJ SOLN
INTRAMUSCULAR | Status: AC
  Administered 2017-02-17: 0.5 mg via INTRAVENOUS
  Filled 2017-02-17: qty 0.5

## 2017-02-17 MED ORDER — FENTANYL CITRATE (PF) 100 MCG/2ML IJ SOLN
INTRAMUSCULAR | Status: AC
  Filled 2017-02-17: qty 2

## 2017-02-17 MED ORDER — HYDROMORPHONE HCL 2 MG/ML IJ SOLN: 0.5000 mg | INTRAMUSCULAR | Status: DC | PRN

## 2017-02-17 MED ORDER — METFORMIN HCL 500 MG PO TABS
500.0000 mg | ORAL_TABLET | Freq: Two times a day (BID) | ORAL | Status: DC
  Administered 2017-02-17 – 2017-02-19 (×4): 500 mg via ORAL
  Filled 2017-02-17 (×4): qty 1

## 2017-02-17 MED ORDER — BUPIVACAINE LIPOSOME 1.3 % IJ SUSP
20.0000 mL | INTRAMUSCULAR | Status: AC
  Administered 2017-02-17: 20 mL
  Filled 2017-02-17: qty 20

## 2017-02-17 MED ORDER — PROMETHAZINE HCL 25 MG/ML IJ SOLN
INTRAMUSCULAR | Status: AC
  Administered 2017-02-17: 6.25 mg via INTRAVENOUS
  Filled 2017-02-17: qty 1

## 2017-02-17 MED ORDER — BUPIVACAINE HCL (PF) 0.25 % IJ SOLN
INTRAMUSCULAR | Status: AC
  Filled 2017-02-17: qty 30

## 2017-02-17 MED ORDER — ACETAMINOPHEN 650 MG RE SUPP: 650.0000 mg | Freq: Four times a day (QID) | RECTAL | Status: DC | PRN

## 2017-02-17 MED ORDER — ACETAMINOPHEN 325 MG PO TABS
650.0000 mg | ORAL_TABLET | Freq: Four times a day (QID) | ORAL | Status: DC | PRN
  Filled 2017-02-17: qty 2

## 2017-02-17 MED ORDER — CEFAZOLIN IN D5W 1 GM/50ML IV SOLN
1.0000 g | Freq: Three times a day (TID) | INTRAVENOUS | Status: AC
  Administered 2017-02-17 – 2017-02-18 (×2): 1 g via INTRAVENOUS
  Filled 2017-02-17 (×2): qty 50

## 2017-02-17 MED ORDER — ONDANSETRON HCL 4 MG/2ML IJ SOLN
INTRAMUSCULAR | Status: AC
  Filled 2017-02-17: qty 2

## 2017-02-17 MED ORDER — OXYCODONE HCL 5 MG PO TABS: 5.0000 mg | ORAL_TABLET | Freq: Four times a day (QID) | ORAL | Status: DC | PRN

## 2017-02-17 MED ORDER — LACTATED RINGERS IV SOLN
INTRAVENOUS | Status: DC | PRN
  Administered 2017-02-17 (×2): via INTRAVENOUS

## 2017-02-17 MED ORDER — MIDAZOLAM HCL 2 MG/2ML IJ SOLN
0.5000 mg | Freq: Once | INTRAMUSCULAR | Status: AC | PRN
  Administered 2017-02-17: 0.5 mg via INTRAVENOUS

## 2017-02-17 MED ORDER — MEPERIDINE HCL 25 MG/ML IJ SOLN: 6.2500 mg | INTRAMUSCULAR | Status: DC | PRN

## 2017-02-17 MED ORDER — CEFAZOLIN SODIUM-DEXTROSE 2-4 GM/100ML-% IV SOLN
INTRAVENOUS | Status: AC
  Filled 2017-02-17: qty 100

## 2017-02-17 MED ORDER — MIDAZOLAM HCL 5 MG/5ML IJ SOLN
INTRAMUSCULAR | Status: DC | PRN
  Administered 2017-02-17 (×2): 1 mg via INTRAVENOUS

## 2017-02-17 MED ORDER — PROPOFOL 500 MG/50ML IV EMUL
INTRAVENOUS | Status: DC | PRN
  Administered 2017-02-17: 13:00:00 via INTRAVENOUS
  Administered 2017-02-17: 75 ug/kg/min via INTRAVENOUS

## 2017-02-17 MED ORDER — PHENYLEPHRINE HCL 10 MG/ML IJ SOLN
INTRAVENOUS | Status: DC | PRN
  Administered 2017-02-17: 25 ug/min via INTRAVENOUS

## 2017-02-17 MED ORDER — LIDOCAINE 2% (20 MG/ML) 5 ML SYRINGE
INTRAMUSCULAR | Status: AC
  Filled 2017-02-17: qty 5

## 2017-02-17 MED ORDER — TRANEXAMIC ACID 1000 MG/10ML IV SOLN
1000.0000 mg | INTRAVENOUS | Status: AC
  Administered 2017-02-17: 1000 mg via INTRAVENOUS
  Filled 2017-02-17: qty 10

## 2017-02-17 MED ORDER — BUPIVACAINE-EPINEPHRINE 0.25% -1:200000 IJ SOLN
INTRAMUSCULAR | Status: DC | PRN
  Administered 2017-02-17: 20 mL

## 2017-02-17 MED ORDER — PROPOFOL 10 MG/ML IV BOLUS
INTRAVENOUS | Status: DC | PRN
  Administered 2017-02-17: 20 mg via INTRAVENOUS

## 2017-02-17 MED ORDER — CEFAZOLIN SODIUM-DEXTROSE 2-4 GM/100ML-% IV SOLN
2.0000 g | INTRAVENOUS | Status: AC
  Administered 2017-02-17: 2 g via INTRAVENOUS

## 2017-02-17 MED ORDER — 0.9 % SODIUM CHLORIDE (POUR BTL) OPTIME
TOPICAL | Status: DC | PRN
  Administered 2017-02-17: 1000 mL

## 2017-02-17 MED ORDER — METOPROLOL TARTRATE 12.5 MG HALF TABLET
12.5000 mg | ORAL_TABLET | Freq: Two times a day (BID) | ORAL | Status: DC
  Administered 2017-02-17 – 2017-02-19 (×4): 12.5 mg via ORAL
  Filled 2017-02-17 (×4): qty 1

## 2017-02-17 SURGICAL SUPPLY — 48 items
BENZOIN TINCTURE PRP APPL 2/3 (GAUZE/BANDAGES/DRESSINGS) ×3 IMPLANT
BLADE SAW SGTL 18X1.27X75 (BLADE) ×2 IMPLANT
BLADE SAW SGTL 18X1.27X75MM (BLADE) ×1
BLADE SURG ROTATE 9660 (MISCELLANEOUS) IMPLANT
CAPT HIP TOTAL 2 ×3 IMPLANT
CELLS DAT CNTRL 66122 CELL SVR (MISCELLANEOUS) ×1 IMPLANT
CLOSURE WOUND 1/2 X4 (GAUZE/BANDAGES/DRESSINGS) ×1
COVER SURGICAL LIGHT HANDLE (MISCELLANEOUS) ×3 IMPLANT
DRAPE C-ARM 42X72 X-RAY (DRAPES) ×3 IMPLANT
DRAPE IMP U-DRAPE 54X76 (DRAPES) ×3 IMPLANT
DRAPE STERI IOBAN 125X83 (DRAPES) ×3 IMPLANT
DRAPE U-SHAPE 47X51 STRL (DRAPES) ×9 IMPLANT
DRSG MEPILEX BORDER 4X8 (GAUZE/BANDAGES/DRESSINGS) ×3 IMPLANT
DURAPREP 26ML APPLICATOR (WOUND CARE) ×3 IMPLANT
ELECT BLADE 4.0 EZ CLEAN MEGAD (MISCELLANEOUS)
ELECT CAUTERY BLADE 6.4 (BLADE) ×3 IMPLANT
ELECT REM PT RETURN 9FT ADLT (ELECTROSURGICAL) ×3
ELECTRODE BLDE 4.0 EZ CLN MEGD (MISCELLANEOUS) IMPLANT
ELECTRODE REM PT RTRN 9FT ADLT (ELECTROSURGICAL) ×1 IMPLANT
FACESHIELD WRAPAROUND (MASK) ×6 IMPLANT
GLOVE BIOGEL PI IND STRL 8 (GLOVE) ×2 IMPLANT
GLOVE BIOGEL PI INDICATOR 8 (GLOVE) ×4
GLOVE ORTHO TXT STRL SZ7.5 (GLOVE) ×6 IMPLANT
GOWN STRL REUS W/ TWL LRG LVL3 (GOWN DISPOSABLE) ×1 IMPLANT
GOWN STRL REUS W/ TWL XL LVL3 (GOWN DISPOSABLE) ×1 IMPLANT
GOWN STRL REUS W/TWL 2XL LVL3 (GOWN DISPOSABLE) ×3 IMPLANT
GOWN STRL REUS W/TWL LRG LVL3 (GOWN DISPOSABLE) ×2
GOWN STRL REUS W/TWL XL LVL3 (GOWN DISPOSABLE) ×2
KIT BASIN OR (CUSTOM PROCEDURE TRAY) ×3 IMPLANT
KIT ROOM TURNOVER OR (KITS) ×3 IMPLANT
MANIFOLD NEPTUNE II (INSTRUMENTS) ×3 IMPLANT
NS IRRIG 1000ML POUR BTL (IV SOLUTION) ×3 IMPLANT
PACK TOTAL JOINT (CUSTOM PROCEDURE TRAY) ×3 IMPLANT
PACK UNIVERSAL I (CUSTOM PROCEDURE TRAY) ×3 IMPLANT
PAD ARMBOARD 7.5X6 YLW CONV (MISCELLANEOUS) ×6 IMPLANT
RTRCTR WOUND ALEXIS 18CM MED (MISCELLANEOUS) ×3
STRIP CLOSURE SKIN 1/2X4 (GAUZE/BANDAGES/DRESSINGS) ×2 IMPLANT
SUT VIC AB 0 CT1 27 (SUTURE) ×2
SUT VIC AB 0 CT1 27XBRD ANBCTR (SUTURE) ×1 IMPLANT
SUT VIC AB 2-0 CT1 27 (SUTURE) ×2
SUT VIC AB 2-0 CT1 TAPERPNT 27 (SUTURE) ×1 IMPLANT
SUT VICRYL 4-0 PS2 18IN ABS (SUTURE) ×3 IMPLANT
SUT VLOC 180 0 24IN GS25 (SUTURE) ×3 IMPLANT
TOWEL OR 17X24 6PK STRL BLUE (TOWEL DISPOSABLE) ×3 IMPLANT
TOWEL OR 17X26 10 PK STRL BLUE (TOWEL DISPOSABLE) ×6 IMPLANT
TRAY CATH 16FR W/PLASTIC CATH (SET/KITS/TRAYS/PACK) IMPLANT
TRAY FOLEY CATH 16FRSI W/METER (SET/KITS/TRAYS/PACK) IMPLANT
WATER STERILE IRR 1000ML POUR (IV SOLUTION) ×6 IMPLANT

## 2017-02-17 NOTE — H&P (Signed)
TOTAL HIP ADMISSION H&P  Patient is admitted for right total hip arthroplasty.  Subjective:  Chief Complaint: right hip pain  HPI: Anita Lewis, 71 y.o. female, has a history of pain and functional disability in the right hip(s) due to arthritis and patient has failed non-surgical conservative treatments for greater than 12 weeks to include NSAID's and/or analgesics, use of assistive devices, weight reduction as appropriate and activity modification.  Onset of symptoms was gradual starting 10 years ago with gradually worsening course since that time.The patient noted no past surgery on the right hip(s).  Patient currently rates pain in the right hip at 10 out of 10 with activity. Patient has night pain, worsening of pain with activity and weight bearing, trendelenberg gait, pain that interfers with activities of daily living and pain with passive range of motion. Patient has evidence of subchondral sclerosis, periarticular osteophytes and joint space narrowing by imaging studies. This condition presents safety issues increasing the risk of falls. .  There is no current active infection.  Patient Active Problem List   Diagnosis Date Noted  . Unilateral primary osteoarthritis, right hip 12/19/2016  . 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 Essential hypertension, benign 11/02/2015  . Hyperlipidemia 11/02/2015  . Morbid obesity due to excess calories (HCC) 11/02/2015   Past Medical History:  Diagnosis Date  . Arthritis   . Diabetes mellitus, type II (HCC)   . Gout   . Headache   . Hypertension     Past Surgical History:  Procedure Laterality Date  . NO PAST SURGERIES    . TUBAL LIGATION      No prescriptions prior to admission.   Allergies  Allergen Reactions  . No Known Allergies     Social History  Substance Use Topics  . Smoking status: Former Games developer  . Smokeless tobacco: Never Used  . Alcohol use No    Family History  Problem Relation  Age of Onset  . Cancer Mother     lung  . Hypertension Mother   . Hypertension Father      Review of Systems  Constitutional: Negative.   HENT: Negative.   Respiratory: Negative.   Cardiovascular: Negative.   Gastrointestinal: Negative.   Genitourinary: Negative.   Musculoskeletal: Positive for joint pain.  Skin: Negative.   Neurological: Negative.   Psychiatric/Behavioral: Negative.     Objective:  Physical Exam  Constitutional: She is oriented to person, place, and time. She appears well-developed. No distress.  HENT:  Head: Normocephalic and atraumatic.  Eyes: EOM are normal. Pupils are equal, round, and reactive to light.  Neck: Normal range of motion.  Respiratory: No respiratory distress.  GI: She exhibits no distension.  Musculoskeletal: She exhibits tenderness.  Neurological: She is alert and oriented to person, place, and time.  Psychiatric: She has a normal mood and affect.    Vital signs in last 24 hours:    Labs:   Estimated body mass index is 32.06 kg/m as calculated from the following:   Height as of 02/10/17: 5\' 7"  (1.702 m).   Weight as of 02/10/17: 204 lb 11.2 oz (92.9 kg).   Imaging Review Plain radiographs demonstrate moderate degenerative joint disease of the right hip(s). The bone quality appears to be good for age and reported activity level.  Assessment/Plan:  End stage arthritis, right hip(s)  The patient history, physical examination, clinical judgement of the provider and imaging studies are consistent with end stage degenerative joint disease of  the right hip(s) and total hip arthroplasty is deemed medically necessary. The treatment options including medical management, injection therapy, arthroscopy and arthroplasty were discussed at length. The risks and benefits of total hip arthroplasty were presented and reviewed. The risks due to aseptic loosening, infection, stiffness, dislocation/subluxation,  thromboembolic complications and  other imponderables were discussed.  The patient acknowledged the explanation, agreed to proceed with the plan and consent was signed. Patient is being admitted for inpatient treatment for surgery, pain control, PT, OT, prophylactic antibiotics, VTE prophylaxis, progressive ambulation and ADL's and discharge planning.The patient is planning to be discharged home with home health services

## 2017-02-17 NOTE — Interval H&P Note (Signed)
History and Physical Interval Note:  02/17/2017 11:20 AM  Anita Lewis  has presented today for surgery, with the diagnosis of Right Hip Osteoarthritis  The various methods of treatment have been discussed with the patient and family. After consideration of risks, benefits and other options for treatment, the patient has consented to  Procedure(s): TOTAL HIP ARTHROPLASTY ANTERIOR APPROACH (Right) as a surgical intervention .  The patient's history has been reviewed, patient examined, no change in status, stable for surgery.  I have reviewed the patient's chart and labs.  Questions were answered to the patient's satisfaction.     Sie Formisano C Meliya Mcconahy   

## 2017-02-17 NOTE — Anesthesia Postprocedure Evaluation (Signed)
Anesthesia Post Note  Patient: Anita Lewis  Procedure(s) Performed: Procedure(s) (LRB): TOTAL HIP ARTHROPLASTY ANTERIOR APPROACH (Right)  Patient location during evaluation: PACU Anesthesia Type: Spinal Level of consciousness: oriented, awake and alert and patient cooperative Pain management: pain level controlled Vital Signs Assessment: post-procedure vital signs reviewed and stable Respiratory status: spontaneous breathing, nonlabored ventilation, respiratory function stable and patient connected to nasal cannula oxygen Cardiovascular status: blood pressure returned to baseline and stable Postop Assessment: no signs of nausea or vomiting Anesthetic complications: no       Last Vitals:  Vitals:   02/17/17 1645 02/17/17 1706  BP: 119/70 126/63  Pulse:  67  Resp: 12 18  Temp:  36.4 C    Last Pain:  Vitals:   02/17/17 1706  TempSrc: Oral  PainSc:                  Marylen Zuk,E. Maham Quintin

## 2017-02-17 NOTE — Transfer of Care (Signed)
Immediate Anesthesia Transfer of Care Note  Patient: Anita Lewis  Procedure(s) Performed: Procedure(s): TOTAL HIP ARTHROPLASTY ANTERIOR APPROACH (Right)  Patient Location: PACU  Anesthesia Type:MAC and Spinal  Level of Consciousness: awake, oriented and patient cooperative  Airway & Oxygen Therapy: Patient Spontanous Breathing and Patient connected to nasal cannula oxygen  Post-op Assessment: Report given to RN and Post -op Vital signs reviewed and stable  Post vital signs: Reviewed  Last Vitals:  Vitals:   02/17/17 1025  BP: (!) 143/74  Pulse: 74  Resp: 20  Temp: 36.6 C    Last Pain:  Vitals:   02/17/17 1040  TempSrc:   PainSc: 0-No pain      Patients Stated Pain Goal: 3 (70/62/37 6283)  Complications: No apparent anesthesia complications

## 2017-02-17 NOTE — Progress Notes (Signed)
Patient will be given 2 doses of Ancef 1g IV postop for abnormal preop UA.  Urine cultures pending.

## 2017-02-17 NOTE — Anesthesia Procedure Notes (Signed)
Procedure Name: MAC Date/Time: 02/17/2017 12:00 PM Performed by: Jenne Campus Pre-anesthesia Checklist: Patient identified, Emergency Drugs available, Suction available, Patient being monitored and Timeout performed Oxygen Delivery Method: Simple face mask

## 2017-02-17 NOTE — Brief Op Note (Signed)
02/17/2017  2:14 PM  PATIENT:  Anita Lewis  71 y.o. female  PRE-OPERATIVE DIAGNOSIS:  Right Hip Osteoarthritis  POST-OPERATIVE DIAGNOSIS:  Right Hip Osteoarthritis  PROCEDURE:  Procedure(s): TOTAL HIP ARTHROPLASTY ANTERIOR APPROACH (Right)  SURGEON:  Surgeon(s) and Role:    * Eldred Manges, MD - Primary  PHYSICIAN ASSISTANT: Zonia Kief pa-c    ANESTHESIA:   spinal  EBL:  Total I/O In: 1500 [I.V.:1500] Out: 450 [Urine:150; Blood:300]  BLOOD ADMINISTERED:none  DRAINS: none   LOCAL MEDICATIONS USED:  MARCAINE/exparel     SPECIMEN:  No Specimen  DISPOSITION OF SPECIMEN:  N/A  COUNTS:  YES  TOURNIQUET:  * No tourniquets in log *  DICTATION: .Dragon Dictation  PLAN OF CARE: Admit to inpatient   PATIENT DISPOSITION:  PACU - hemodynamically stable.

## 2017-02-17 NOTE — Op Note (Signed)
Preop diagnosis: Right hip osteoarthritis  Postop diagnosis: Same  Procedure: Right direct anterior total hip arthroplasty  Surgeon: Annell Greening M.D.  Assistant: Zonia Kief PA-C medically necessary and present for the entire procedure  Anesthesia: Spinal less Marcaine and expiril (20+20 = 40 total was )  Implants: Grips and 52 mm cup one single dome screw Polly no lip liner +5 ceramic head, #11Corail stem  Procedure after induction of spinal anesthesia a Foley catheter placement of large boots for the [facility] table, C-arm was brought in good visualization both trochanters were able to be visualized she was the 4 mm short due to wear of her hip cartilage. Prepping with DuraPrep after 10:15 drapes were applied. You drapes large shower curtain Betadine Steri-Drape was applied. Timeout procedure was completed Ancef was given prophylactically. Direct anterior approach was made. Patient had the 67 cm subtendinous tissue before fascia was encountered fascia was neck extended grasped with an Allis clamp elevated. They'll cover was used and once anterior capsule was cleaned off transverse arteries were coagulated covers are placed directly on the neck checked under C-arm the neck cut was made 1 fingerbreadth above the lesser trochanter. Head was removed with a corkscrew sequential reaming removal of the labrum. Initially the 52 cup was inserted and felt secure but as the the apex hole remainder was being placed for cup shifted slightly. I do plan to place one single dome all, was removed repeat reaming some additional soft tissue removal around the rim edges and then reinsertion of the cup. It said down securely a single dome screw 25 mm in length was placed. Apex hole illuminator was inserted cup was secure tight and the Polly was inserted. It was checked under fluoroscopy to make sure alignment and position lung good. Posterior capsule was resected leg was taken down and under with extension and external  rotation 110. Preparation of the femoral canal was done usual fashion using a curette Ronjair, chili peppers sequential broaching and then finally insertion of the #11 stem which filled the canal and was within 2 mm sending flush with the calcar with collar. Until the canal from anterior posterior was extremely tight. Reduction drawn excising and +5 was selected which restored leg length. This was visualized under C-arm. Hip was stable in the extension down the 45 90 external rotation had trace shuck test. Irrigation closure of the the deep fascia running suture subtendinous reapproximation skin closure staples postop dressing and transferred recovery room. Patient tolerated the procedure well. Expiratory limb Marcaine was then injected at the time of closure to help with postoperative analgesia.

## 2017-02-17 NOTE — Anesthesia Preprocedure Evaluation (Addendum)
Anesthesia Evaluation  Patient identified by MRN, date of birth, ID band Patient awake    Reviewed: Allergy & Precautions, NPO status , Patient's Chart, lab work & pertinent test results  History of Anesthesia Complications Negative for: history of anesthetic complications  Airway Mallampati: II  TM Distance: >3 FB Neck ROM: Full    Dental  (+) Dental Advisory Given, Chipped   Pulmonary former smoker,    breath sounds clear to auscultation       Cardiovascular hypertension, Pt. on medications and Pt. 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), Type 2, Insulin Dependent, Oral Hypoglycemic AgentsMorbid obesity  Renal/GU negative Renal ROS     Musculoskeletal  (+) Arthritis , Osteoarthritis,    Abdominal (+) + obese,   Peds  Hematology negative hematology ROS (+)   Anesthesia Other Findings   Reproductive/Obstetrics                           Anesthesia Physical Anesthesia Plan  ASA: III  Anesthesia Plan: Spinal   Post-op Pain Management:    Induction: Intravenous  Airway Management Planned: Oral ETT  Additional Equipment:   Intra-op Plan:   Post-operative Plan: Extubation in OR  Informed Consent: I have reviewed the patients History and Physical, chart, labs and discussed the procedure including the risks, benefits and alternatives for the proposed anesthesia with the patient or authorized representative who has indicated his/her understanding and acceptance.   Dental advisory given  Plan Discussed with: CRNA and Surgeon  Anesthesia Plan Comments: (Plan routine monitors, GETA Pt declines SAB...addendum, pt changes her mind, plan routine monitors, SAB at pt's request)       Anesthesia Quick Evaluation

## 2017-02-17 NOTE — Anesthesia Procedure Notes (Signed)
Spinal  Patient location during procedure: OR End time: 02/17/2017 11:59 AM Staffing Anesthesiologist: Jairo Ben Performed: anesthesiologist  Preanesthetic Checklist Completed: patient identified, site marked, surgical consent, pre-op evaluation, timeout performed, IV checked, risks and benefits discussed and monitors and equipment checked Spinal Block Patient position: sitting Prep: ChloraPrep and site prepped and draped Patient monitoring: blood pressure, continuous pulse ox, cardiac monitor and heart rate Approach: midline Location: L2-3 Injection technique: single-shot Needle Needle type: Quincke  Needle gauge: 25 G Needle length: 9 cm Additional Notes Pt identified in Operating room.  Monitors applied. Working IV access confirmed. Sterile prep, drape lumbar spine.  1% lido local L 3,4.  #25ga Quincke, os. Repeat local L 2,3, and #25ga Quincke into clear CSF L 2,3.  12 mg 0.75% Bupivacaine with dextrose injected with asp CSF beginning and end of injection.  Patient asymptomatic, VSS, no heme aspirated, tolerated well.  Sandford Craze, MD

## 2017-02-17 NOTE — Interval H&P Note (Signed)
History and Physical Interval Note:  02/17/2017 11:20 AM  Anita Lewis  has presented today for surgery, with the diagnosis of Right Hip Osteoarthritis  The various methods of treatment have been discussed with the patient and family. After consideration of risks, benefits and other options for treatment, the patient has consented to  Procedure(s): TOTAL HIP ARTHROPLASTY ANTERIOR APPROACH (Right) as a surgical intervention .  The patient's history has been reviewed, patient examined, no change in status, stable for surgery.  I have reviewed the patient's chart and labs.  Questions were answered to the patient's satisfaction.     Eldred Manges

## 2017-02-18 ENCOUNTER — Encounter (HOSPITAL_COMMUNITY): Payer: Self-pay | Admitting: Orthopaedic Surgery

## 2017-02-18 LAB — URINE CULTURE
CULTURE: NO GROWTH
Culture: NO GROWTH

## 2017-02-18 LAB — GLUCOSE, CAPILLARY
GLUCOSE-CAPILLARY: 96 mg/dL (ref 65–99)
GLUCOSE-CAPILLARY: 122 mg/dL — AB (ref 65–99)
GLUCOSE-CAPILLARY: 132 mg/dL — AB (ref 65–99)
Glucose-Capillary: 104 mg/dL — ABNORMAL HIGH (ref 65–99)

## 2017-02-18 LAB — HEMOGLOBIN AND HEMATOCRIT, BLOOD
HCT: 29.6 % — ABNORMAL LOW (ref 36.0–46.0)
Hemoglobin: 8.9 g/dL — ABNORMAL LOW (ref 12.0–15.0)

## 2017-02-18 LAB — CBC
HCT: 28.3 % — ABNORMAL LOW (ref 36.0–46.0)
HEMOGLOBIN: 8.5 g/dL — AB (ref 12.0–15.0)
MCH: 24.7 pg — ABNORMAL LOW (ref 26.0–34.0)
MCHC: 30 g/dL (ref 30.0–36.0)
MCV: 82.3 fL (ref 78.0–100.0)
PLATELETS: 214 10*3/uL (ref 150–400)
RBC: 3.44 MIL/uL — AB (ref 3.87–5.11)
RDW: 16.2 % — AB (ref 11.5–15.5)
WBC: 9.5 10*3/uL (ref 4.0–10.5)

## 2017-02-18 LAB — BASIC METABOLIC PANEL WITH GFR
Anion gap: 9 (ref 5–15)
BUN: 23 mg/dL — ABNORMAL HIGH (ref 6–20)
CO2: 24 mmol/L (ref 22–32)
Calcium: 8.5 mg/dL — ABNORMAL LOW (ref 8.9–10.3)
Chloride: 105 mmol/L (ref 101–111)
Creatinine, Ser: 1.54 mg/dL — ABNORMAL HIGH (ref 0.44–1.00)
GFR calc Af Amer: 39 mL/min — ABNORMAL LOW
GFR calc non Af Amer: 33 mL/min — ABNORMAL LOW
Glucose, Bld: 171 mg/dL — ABNORMAL HIGH (ref 65–99)
Potassium: 4.4 mmol/L (ref 3.5–5.1)
Sodium: 138 mmol/L (ref 135–145)

## 2017-02-18 NOTE — Evaluation (Signed)
Physical Therapy Evaluation Patient Details Name: Anita Lewis MRN: [id number] DOB: 1946/01/09 Today's Date: 02/18/2017   History of Present Illness  71 y.o. female s/p direct anterior THA Rt. PMH: HTN, diabetes.   Clinical Impression  Pt is s/p direct anterior Rt THA resulting in the deficits listed below (see PT Problem List). Pt able to ambulate 50 ft with rw and min guard assist. Anticipate that the pt will D/C to home with family support following her acute stay.  Pt will benefit from skilled PT to increase their independence and safety with mobility.       Follow Up Recommendations Home health PT;Supervision for mobility/OOB    Equipment Recommendations  Rolling walker with 5" wheels    Recommendations for Other Services       Precautions / Restrictions Precautions Precautions: Fall Precaution Comments: no hip precautions Restrictions Weight Bearing Restrictions: Yes RLE Weight Bearing: Weight bearing as tolerated      Mobility  Bed Mobility               General bed mobility comments: pt sitting in chair upon arrival  Transfers Overall transfer level: Needs assistance Equipment used: Rolling walker (2 wheeled) Transfers: Sit to/from Stand Sit to Stand: Min guard         General transfer comment: cues for hand placement provided  Ambulation/Gait Ambulation/Gait assistance: Min guard Ambulation Distance (Feet): 50 Feet Assistive device: Rolling walker (2 wheeled) Gait Pattern/deviations: Step-to pattern;Step-through pattern Gait velocity: decreased   General Gait Details: progressing to step through pattern over time. Pain with weightbearing through Rt LE  Stairs            Wheelchair Mobility    Modified Rankin (Stroke Patients Only)       Balance Overall balance assessment: Needs assistance Sitting-balance support: No upper extremity supported Sitting balance-Leahy Scale: Good     Standing balance support: Bilateral upper  extremity supported Standing balance-Leahy Scale: Poor Standing balance comment: using rw                             Pertinent Vitals/Pain Pain Assessment: Faces Faces Pain Scale: Hurts little more (with ambulation, no pain at rest) Pain Location: Rt hip Pain Descriptors / Indicators: Grimacing Pain Intervention(s): Limited activity within patient's tolerance;Monitored during session (declined ice)    Home Living Family/patient expects to be discharged to:: Private residence Living Arrangements: Spouse/significant other Available Help at Discharge: Family;Available 24 hours/day Type of Home: House Home Access: Stairs to enter Entrance Stairs-Rails: None Entrance Stairs-Number of Steps: 1 Home Layout: Two level;Able to live on main level with bedroom/bathroom Home Equipment: None Additional Comments: will have husband, son, and daughter to assist at home    Prior Function Level of Independence: Independent               Hand Dominance        Extremity/Trunk Assessment   Upper Extremity Assessment Upper Extremity Assessment: Overall WFL for tasks assessed    Lower Extremity Assessment Lower Extremity Assessment: RLE deficits/detail RLE Deficits / Details: assist needed for lateral slides, able to advance independently during ambulation.        Communication   Communication: No difficulties  Cognition Arousal/Alertness: Awake/alert Behavior During Therapy: WFL for tasks assessed/performed Overall Cognitive Status: Within Functional Limits for tasks assessed                      General Comments  Exercises Total Joint Exercises Ankle Circles/Pumps: AROM;Both;15 reps Quad Sets: Strengthening;Right;10 reps Heel Slides: AAROM;Right;10 reps Hip ABduction/ADduction: Strengthening;Right;10 reps (min assist)   Assessment/Plan    PT Assessment Patient needs continued PT services  PT Problem List Decreased strength;Decreased range of  motion;Decreased activity tolerance;Decreased balance;Decreased mobility;Decreased knowledge of use of DME       PT Treatment Interventions DME instruction;Gait training;Stair training;Functional mobility training;Therapeutic activities;Therapeutic exercise;Balance training;Patient/family education    PT Goals (Current goals can be found in the Care Plan section)  Acute Rehab PT Goals Patient Stated Goal: walk without pain PT Goal Formulation: With patient Time For Goal Achievement: 03/04/17 Potential to Achieve Goals: Good    Frequency 7X/week   Barriers to discharge        Co-evaluation               End of Session Equipment Utilized During Treatment: Gait belt Activity Tolerance: Patient tolerated treatment well Patient left: in chair;with call bell/phone within reach;with family/visitor present Nurse Communication: Mobility status PT Visit Diagnosis: Unsteadiness on feet (R26.81);Muscle weakness (generalized) (M62.81);Difficulty in walking, not elsewhere classified (R26.2)         Time: 2956-2130 PT Time Calculation (min) (ACUTE ONLY): 27 min   Charges:   PT Evaluation $PT Eval Moderate Complexity: 1 Procedure PT Treatments $Gait Training: 8-22 mins   PT G Codes:         Christiane Ha, PT, CSCS Pager [phone] Office [phone]  02/18/2017, 9:57 AM

## 2017-02-18 NOTE — Evaluation (Signed)
Occupational Therapy Evaluation and Discharge Patient Details Name: Anita Lewis MRN: [id number] DOB: August 30, 1946 Today's Date: 02/18/2017    History of Present Illness 71 y.o. female s/p direct anterior THA Rt. PMH: HTN, diabetes.    Clinical Impression   PTA pt independent in ADL and mobility. Pt currently mod A for LB ADL and min guard for mobility with RW. Please see ADL performance level below. OT education complete. Thank you for this referral. OT to sign off at this point.     Follow Up Recommendations  No OT follow up;Supervision/Assistance - 24 hour (initially)    Equipment Recommendations  3 in 1 bedside commode    Recommendations for Other Services       Precautions / Restrictions Precautions Precautions: Fall Precaution Comments: no hip precautions Restrictions Weight Bearing Restrictions: Yes RLE Weight Bearing: Weight bearing as tolerated      Mobility Bed Mobility               General bed mobility comments: pt sitting in chair upon arrival  Transfers Overall transfer level: Needs assistance Equipment used: Rolling walker (2 wheeled) Transfers: Sit to/from Stand Sit to Stand: Min guard         General transfer comment: cues for hand placement provided    Balance Overall balance assessment: Needs assistance Sitting-balance support: No upper extremity supported Sitting balance-Leahy Scale: Good     Standing balance support: Single extremity supported;During functional activity Standing balance-Leahy Scale: Fair Standing balance comment: performed sink level grooming                            ADL Overall ADL's : Needs assistance/impaired     Grooming: Wash/dry hands;Oral care;Min guard;Standing Grooming Details (indicate cue type and reason): sink level Upper Body Bathing: Supervision/ safety;Sitting   Lower Body Bathing: Moderate assistance;With caregiver independent assisting;Sitting/lateral leans Lower Body Bathing  Details (indicate cue type and reason): verbally educated Pt in AE kit - Pt declined Upper Body Dressing : Set up   Lower Body Dressing: Moderate assistance;With caregiver independent assisting Lower Body Dressing Details (indicate cue type and reason): verbally educated Pt in [place] declined; Pt educated to dress operated leg first Toilet Transfer: Min guard;Cueing for sequencing;Ambulation;BSC;RW Toilet Transfer Details (indicate cue type and reason): simulated with transfer to recliner, vc for hand placement, min guard for safety     Tub/ Shower Transfer: Walk-in shower;Min guard;Ambulation;With caregiver independent assisting;Rolling walker;3 in 1 Tub/Shower Transfer Details (indicate cue type and reason): educated Pt on use of 3 in 1 as shower chair Functional mobility during ADLs: Min guard;Rolling walker General ADL Comments: Pt will have family who are willing and able to assist with LB ADL. Pt declined further edcuation with AE hip kit     Vision   Vision Assessment?: No apparent visual deficits     Perception     Praxis      Pertinent Vitals/Pain Pain Assessment: 0-10 Pain Score: 6  Faces Pain Scale: Hurts little more (with ambulation, no pain at rest) Pain Location: Rt hip Pain Descriptors / Indicators: Grimacing;Sore Pain Intervention(s): Monitored during session;Repositioned;Patient requesting pain meds-RN notified;Ice applied     Hand Dominance     Extremity/Trunk Assessment Upper Extremity Assessment Upper Extremity Assessment: Overall WFL for tasks assessed   Lower Extremity Assessment Lower Extremity Assessment: RLE deficits/detail RLE Deficits / Details: deficits in strength and ROM post-op   Cervical / Trunk Assessment Cervical /  Trunk Assessment: Normal   Communication Communication Communication: No difficulties   Cognition Arousal/Alertness: Awake/alert Behavior During Therapy: WFL for tasks assessed/performed Overall Cognitive Status:  Within Functional Limits for tasks assessed                     General Comments  Husband, grandaughter, and grandson present for session    Exercises      Shoulder Instructions      Home Living Family/patient expects to be discharged to:: Private residence Living Arrangements: Spouse/significant other Available Help at Discharge: Family;Available 24 hours/day Type of Home: House Home Access: Stairs to enter [employer] of Steps: 1 Entrance Stairs-Rails: None Home Layout: Two level;Able to live on main level with bedroom/bathroom Alternate Level Stairs-Number of Steps: flight   Bathroom Shower/Tub: Occupational psychologist: Standard Bathroom Accessibility: Yes How Accessible: Accessible via walker Home Equipment: None   Additional Comments: will have husband, son, and daughter to assist at home      Prior Functioning/Environment Level of Independence: Independent                 OT Problem List: Decreased strength;Decreased range of motion;Decreased activity tolerance;Impaired balance (sitting and/or standing);Decreased knowledge of use of DME or AE;Pain      OT Treatment/Interventions:      OT Goals(Current goals can be found in the care plan section) Acute Rehab OT Goals Patient Stated Goal: walk without pain OT Goal Formulation: With patient Time For Goal Achievement: 02/25/17 Potential to Achieve Goals: Good  OT Frequency:     Barriers to D/C:            Co-evaluation              End of Session Equipment Utilized During Treatment: Gait belt;Rolling walker Nurse Communication: Patient requests pain meds;Other (comment) (in room with Pt administering medicine)  Activity Tolerance: Patient tolerated treatment well Patient left: in chair;with call bell/phone within reach;with nursing/sitter in room;with family/visitor present  OT Visit Diagnosis: Other abnormalities of gait and mobility (R26.89)                ADL  either performed or assessed with clinical judgement  Time: 1104-1130 OT Time Calculation (min): 26 min Charges:  OT General Charges $OT Visit: 1 Procedure OT Evaluation $OT Eval Moderate Complexity: 1 Procedure OT Treatments $Self Care/Home Management : 8-22 mins G-Codes:     Hulda Humphrey OTR/L [place] 02/18/2017, 11:42 AM

## 2017-02-18 NOTE — Care Management Note (Signed)
Case Management Note  Patient Details  Name: Anita Lewis MRN: [id number] Date of Birth: 01-30-1946  Subjective/Objective:  71 yr old female s/p right total hip arthroplasty, anterior approach.                  Action/Plan: Case manager spoke with patient and family concerning Home health and DME needs. Choice for Home Health Agency was offered to patient. She states she lives in [place], [place]. Case Manager contacted [facility] (formerly [facility] Spoke with Marylu Lund. She states that RN can see patient on 02/20/17 to open case  and PT will start on Friday, 02/21/17. CM has requested RW and 3in1 from [facility]. Patient will have family support at discharge.    Expected Discharge Date:    02/19/17              Expected Discharge Plan:  Home w Home Health Services  In-House Referral:     Discharge planning Services  CM Consult  Post Acute Care Choice:  Durable Medical Equipment, Home Health Choice offered to:     DME Arranged:  3-N-1, Walker rolling DME Agency:  [facility]  HH Arranged:  RN, PT [facility] Agency:  [facility]  Status of Service:  Completed, signed off  If discussed at Long Length of Stay Meetings, dates discussed:    Additional Comments:  Durenda Guthrie, RN 02/18/2017, 11:43 AM

## 2017-02-18 NOTE — Progress Notes (Signed)
Physical Therapy Treatment Patient Details Name: Anita Lewis MRN: [id number] DOB: [birth year] Today's Date: 02/18/2017    History of Present Illness 71 y.o. female s/p direct anterior THA Rt. PMH: HTN, diabetes.     PT Comments    Pt able to progress her ambulation distance to 75 ft with rw and min guard assistance. Anticipate that the pt will D/C to home with family support following acute stay.    Follow Up Recommendations  Home health PT;Supervision for mobility/OOB     Equipment Recommendations  Rolling walker with 5" wheels    Recommendations for Other Services       Precautions / Restrictions Precautions Precautions: Fall Precaution Comments: no hip precautions Restrictions Weight Bearing Restrictions: Yes RLE Weight Bearing: Weight bearing as tolerated    Mobility  Bed Mobility Overal bed mobility: Needs Assistance Bed Mobility: Sit to Supine       Sit to supine: Min assist   General bed mobility comments: min assist provided with Rt LE.   Transfers Overall transfer level: Needs assistance Equipment used: Rolling walker (2 wheeled) Transfers: Sit to/from Stand Sit to Stand: Min guard         General transfer comment: cues for hand placement provided  Ambulation/Gait Ambulation/Gait assistance: Min guard Ambulation Distance (Feet): 75 Feet Assistive device: Rolling walker (2 wheeled) Gait Pattern/deviations: Step-through pattern Gait velocity: decreased   General Gait Details: cues for even strides and posture   Stairs            Wheelchair Mobility    Modified Rankin (Stroke Patients Only)       Balance Overall balance assessment: Needs assistance Sitting-balance support: No upper extremity supported Sitting balance-Leahy Scale: Good     Standing balance support: During functional activity Standing balance-Leahy Scale: Fair Standing balance comment: able to stand static without UE support                     Cognition Arousal/Alertness: Awake/alert Behavior During Therapy: WFL for tasks assessed/performed Overall Cognitive Status: Within Functional Limits for tasks assessed                      Exercises      General Comments General comments (skin integrity, edema, etc.): Husband, grandaughter, and grandson present for session      Pertinent Vitals/Pain Pain Assessment: 0-10 Pain Score: 4  Pain Location: Rt hip Pain Descriptors / Indicators: Sore Pain Intervention(s): Limited activity within patient's tolerance;Monitored during session;Ice applied    Home Living Family/patient expects to be discharged to:: Private residence Living Arrangements: Spouse/significant other Available Help at Discharge: Family;Available 24 hours/day Type of Home: House Home Access: Stairs to enter Entrance Stairs-Rails: None Home Layout: Two level;Able to live on main level with bedroom/bathroom Home Equipment: None Additional Comments: will have husband, son, and daughter to assist at home    Prior Function Level of Independence: Independent          PT Goals (current goals can now be found in the care plan section) Acute Rehab PT Goals Patient Stated Goal: walk without pain PT Goal Formulation: With patient Time For Goal Achievement: 03/04/17 Potential to Achieve Goals: Good Progress towards PT goals: Progressing toward goals    Frequency    7X/week      PT Plan Current plan remains appropriate    Co-evaluation             End of Session Equipment Utilized During Treatment: Gait belt  Activity Tolerance: Patient tolerated treatment well Patient left: in bed;with call bell/phone within reach Nurse Communication: Mobility status PT Visit Diagnosis: Unsteadiness on feet (R26.81);Muscle weakness (generalized) (M62.81)     Time: 4098-1191 PT Time Calculation (min) (ACUTE ONLY): 21 min  Charges:  $Gait Training: 8-22 mins                    G Codes:        Christiane Ha, PT, CSCS Pager [phone] Office [phone]  02/18/2017, 2:28 PM

## 2017-02-18 NOTE — Progress Notes (Signed)
   Subjective: 1 Day Post-Op Procedure(s) (LRB): TOTAL HIP ARTHROPLASTY ANTERIOR APPROACH (Right) Patient reports pain as 4 on 0-10 scale.    Objective: Vital signs in last 24 hours: Temp:  [97 F (36.1 C)-98.3 F (36.8 C)] 98.3 F (36.8 C) (02/20 0431) Pulse Rate:  [57-91] 83 (02/20 0431) Resp:  [12-20] 16 (02/20 0431) BP: (99-143)/(50-80) 108/50 (02/20 0500) SpO2:  [96 %-100 %] 99 % (02/20 0431) Weight:  [204 lb (92.5 kg)] 204 lb (92.5 kg) (02/19 1025)  Intake/Output from previous day: 02/19 0701 - 02/20 0700 In: 2012.7 [P.O.:240; I.V.:1722.7; IV Piggyback:50] Out: 1450 [Urine:1150; Blood:300] Intake/Output this shift: No intake/output data recorded.   Recent Labs  02/17/17 1018 02/18/17 0612  HGB 11.6* 8.5*    Recent Labs  02/17/17 1018 02/18/17 0612  WBC  --  9.5  RBC  --  3.44*  HCT 38.3 28.3*  PLT  --  214    Recent Labs  02/18/17 0612  NA 138  K 4.4  CL 105  CO2 24  BUN 23*  CREATININE 1.54*  GLUCOSE 171*  CALCIUM 8.5*   No results for input(s): LABPT, INR in the last 72 hours.  Neurologically intact Dg Pelvis Portable  Result Date: 02/17/2017 CLINICAL DATA:  71 year old female with right hip replacement. Initial encounter. EXAM: DG C-ARM 61-120 MIN; PORTABLE PELVIS 1-2 VIEWS COMPARISON:  None. FINDINGS: Four intraoperative C-arm views submitted for review after surgery. Right total hip replacement appears in satisfactory position on the images submitted. The inferior aspect of the femoral component is not imaged. Lateral view not obtained. IMPRESSION: Post right hip replacement. Electronically Signed   By: Lacy Duverney M.D.   On: 02/17/2017 14:49   Dg C-arm 61-120 Min  Result Date: 02/17/2017 CLINICAL DATA:  71 year old female with right hip replacement. Initial encounter. EXAM: DG C-ARM 61-120 MIN; PORTABLE PELVIS 1-2 VIEWS COMPARISON:  None. FINDINGS: Four intraoperative C-arm views submitted for review after surgery. Right total hip  replacement appears in satisfactory position on the images submitted. The inferior aspect of the femoral component is not imaged. Lateral view not obtained. IMPRESSION: Post right hip replacement. Electronically Signed   By: Lacy Duverney M.D.   On: 02/17/2017 14:49   Dg Hip Operative Unilat With Pelvis Right  Result Date: 02/17/2017 CLINICAL DATA:  Total hip arthroplasty RIGHT, anterior approach EXAM: OPERATIVE RIGHT HIP (WITH PELVIS IF PERFORMED) 4 VIEWS TECHNIQUE: Fluoroscopic spot image(s) were submitted for interpretation post-operatively. FLUOROSCOPY TIME:  0 minutes 38 seconds new Images obtained: 4 COMPARISON:  None FINDINGS: Osseous demineralization. Osteoarthritic changes RIGHT hip joint. Placement of acetabular and femoral components of a RIGHT hip prosthesis. No fracture or bone destruction identified though the tip of the femoral component is not completely imaged on the submitted images. IMPRESSION: RIGHT hip prosthesis and osseous demineralization as above. Electronically Signed   By: Ulyses Southward M.D.   On: 02/17/2017 14:49    Assessment/Plan: 1 Day Post-Op Procedure(s) (LRB): TOTAL HIP ARTHROPLASTY ANTERIOR APPROACH (Right) Up with therapy likely home Wednesday.   SL IV. Dressing change.   Eldred Manges 02/18/2017, 7:45 AM

## 2017-02-19 LAB — BASIC METABOLIC PANEL
ANION GAP: 9 (ref 5–15)
BUN: 24 mg/dL — ABNORMAL HIGH (ref 6–20)
CALCIUM: 8.6 mg/dL — AB (ref 8.9–10.3)
CHLORIDE: 103 mmol/L (ref 101–111)
CO2: 23 mmol/L (ref 22–32)
Creatinine, Ser: 1.71 mg/dL — ABNORMAL HIGH (ref 0.44–1.00)
GFR calc non Af Amer: 29 mL/min — ABNORMAL LOW (ref >60)
GFR, EST AFRICAN AMERICAN: 34 mL/min — AB (ref >60)
Glucose, Bld: 137 mg/dL — ABNORMAL HIGH (ref 65–99)
POTASSIUM: 4.6 mmol/L (ref 3.5–5.1)
Sodium: 135 mmol/L (ref 135–145)

## 2017-02-19 LAB — CBC
HEMATOCRIT: 27.3 % — AB (ref 36.0–46.0)
HEMOGLOBIN: 8.3 g/dL — AB (ref 12.0–15.0)
MCH: 25 pg — ABNORMAL LOW (ref 26.0–34.0)
MCHC: 30.4 g/dL (ref 30.0–36.0)
MCV: 82.2 fL (ref 78.0–100.0)
Platelets: 208 10*3/uL (ref 150–400)
RBC: 3.32 MIL/uL — AB (ref 3.87–5.11)
RDW: 16.5 % — AB (ref 11.5–15.5)
WBC: 12 10*3/uL — AB (ref 4.0–10.5)

## 2017-02-19 MED ORDER — ASPIRIN 325 MG PO TBEC: 325.0000 mg | DELAYED_RELEASE_TABLET | Freq: Every day | ORAL | 0 refills | Status: DC

## 2017-02-19 MED ORDER — METHOCARBAMOL 500 MG PO TABS: 500.0000 mg | ORAL_TABLET | Freq: Four times a day (QID) | ORAL | 0 refills | Status: DC | PRN

## 2017-02-19 MED ORDER — OXYCODONE-ACETAMINOPHEN 5-325 MG PO TABS: 1.0000 | ORAL_TABLET | Freq: Four times a day (QID) | ORAL | 0 refills | Status: DC | PRN

## 2017-02-19 NOTE — Progress Notes (Signed)
Eight unit computers on wheels were down this am. Did not have computer to scan Percocet's (2 ) po given to pt as requested for pain at 0900

## 2017-02-19 NOTE — Progress Notes (Signed)
Pt AVS and d/c order now entered and complete. Pt will be discharge appropriately.

## 2017-02-19 NOTE — Progress Notes (Signed)
   Subjective: 2 Days Post-Op Procedure(s) (LRB): TOTAL HIP ARTHROPLASTY ANTERIOR APPROACH (Right) Patient reports pain as moderate.  Mostly soreness in hip and thigh.  Objective: Vital signs in last 24 hours: Temp:  [98.5 F (36.9 C)-99.8 F (37.7 C)] 99.8 F (37.7 C) (02/21 0433) Pulse Rate:  [85-99] 95 (02/21 0433) Resp:  [16] 16 (02/21 0433) BP: (105-115)/(49-60) 105/49 (02/21 0433) SpO2:  [96 %-99 %] 96 % (02/21 0433)  Intake/Output from previous day: 02/20 0701 - 02/21 0700 In: 600 [P.O.:600] Out: -  Intake/Output this shift: No intake/output data recorded.   Recent Labs  02/17/17 1018 02/18/17 0612 02/18/17 1133 02/19/17 0451  HGB 11.6* 8.5* 8.9* 8.3*    Recent Labs  02/18/17 0612 02/18/17 1133 02/19/17 0451  WBC 9.5  --  12.0*  RBC 3.44*  --  3.32*  HCT 28.3* 29.6* 27.3*  PLT 214  --  208    Recent Labs  02/18/17 0612 02/19/17 0451  NA 138 135  K 4.4 4.6  CL 105 103  CO2 24 23  BUN 23* 24*  CREATININE 1.54* 1.71*  GLUCOSE 171* 137*  CALCIUM 8.5* 8.6*   No results for input(s): LABPT, INR in the last 72 hours.  dressing dry. LL equal length equal No results found.  Assessment/Plan: 2 Days Post-Op Procedure(s) (LRB): TOTAL HIP ARTHROPLASTY ANTERIOR APPROACH (Right) Plan:  Therapy today and home this afternoon. Likely does not need HHPT and HHRN  Eldred Manges 02/19/2017, 7:44 AM

## 2017-02-19 NOTE — Progress Notes (Signed)
Physical Therapy Treatment Patient Details Name: Anita Lewis MRN: [id number] DOB: 04-17-1946 Today's Date: 02/19/2017    History of Present Illness 71 y.o. female s/p direct anterior THA Rt. PMH: HTN, diabetes.     PT Comments    Patient is making good progress with PT.  From a mobility standpoint anticipate patient will be ready for DC home with family support. Pt reporting that she does not feel like she needs any HHPT services at D/C. Reviewed HEP, activity/gait progression.      Follow Up Recommendations  No PT follow up;Supervision for mobility/OOB (pt declining HHPT services)     Equipment Recommendations  Rolling walker with 5" wheels    Recommendations for Other Services       Precautions / Restrictions Precautions Precautions: Fall Precaution Comments: no hip precautions Restrictions Weight Bearing Restrictions: Yes RLE Weight Bearing: Weight bearing as tolerated    Mobility  Bed Mobility               General bed mobility comments: pt sitting in chair upon arrival  Transfers Overall transfer level: Needs assistance Equipment used: Rolling walker (2 wheeled) Transfers: Sit to/from Stand Sit to Stand: Supervision         General transfer comment: no cues needed  Ambulation/Gait Ambulation/Gait assistance: Min guard Ambulation Distance (Feet): 140 Feet Assistive device: Rolling walker (2 wheeled) Gait Pattern/deviations: Step-through pattern Gait velocity: decreased   General Gait Details: cues for even strides and posture   Stairs Stairs: Yes   Stair Management: No rails;Backwards;With walker;Step to pattern Number of Stairs: 1 General stair comments: good stability, cues for sequence. Pt reports feeling confident to perform at home.   Wheelchair Mobility    Modified Rankin (Stroke Patients Only)       Balance Overall balance assessment: Needs assistance Sitting-balance support: No upper extremity supported Sitting  balance-Leahy Scale: Good     Standing balance support: During functional activity Standing balance-Leahy Scale: Fair Standing balance comment: able to stand static without UE support                    Cognition Arousal/Alertness: Awake/alert Behavior During Therapy: WFL for tasks assessed/performed Overall Cognitive Status: Within Functional Limits for tasks assessed                      Exercises Total Joint Exercises Ankle Circles/Pumps: AROM;Both;15 reps Quad Sets: Strengthening;Right;10 reps Short Arc Quad: Strengthening;Right;10 reps Heel Slides: AAROM;Right;10 reps Hip ABduction/ADduction: Strengthening;Right;10 reps    General Comments        Pertinent Vitals/Pain Pain Assessment: Faces Faces Pain Scale: Hurts little more (no pain at rest, pain with ambulation) Pain Location: Rt hip Pain Descriptors / Indicators: Sore Pain Intervention(s): Limited activity within patient's tolerance;Monitored during session    Home Living                      Prior Function            PT Goals (current goals can now be found in the care plan section) Acute Rehab PT Goals Patient Stated Goal: get home PT Goal Formulation: With patient Time For Goal Achievement: 03/04/17 Potential to Achieve Goals: Good Progress towards PT goals: Progressing toward goals    Frequency    7X/week      PT Plan Current plan remains appropriate    Co-evaluation             End of Session Equipment  Utilized During Treatment: Gait belt Activity Tolerance: Patient tolerated treatment well Patient left: in chair;with call bell/phone within reach Nurse Communication: Mobility status PT Visit Diagnosis: Unsteadiness on feet (R26.81);Muscle weakness (generalized) (M62.81)     Time: 6962-9528 PT Time Calculation (min) (ACUTE ONLY): 23 min  Charges:  $Gait Training: 8-22 mins $Therapeutic Exercise: 8-22 mins                    G Codes:       Christiane Ha, PT, CSCS Pager [phone] Office [phone]  02/19/2017, 8:45 AM

## 2017-02-19 NOTE — Progress Notes (Signed)
Physician contacted in the OR regarding discharge of pt from unit to home today as his am note reflects discharge home this pm. Pt states she is ready to go and family here to transport at this time. Physical therapy has worked with pt this date and states "pt is good to go from their standpoint".  Physician contacted in the OR and spoke with him over the phone and this RN was instructed to take a verbal order to discharge the pt home. This RN was attempting to state to physician that he or one of his associates would be needed to complete the discharge AVS  As this information is a vital piece of information for the pt at discharge. Physician hung up phone without acknowledging the need or stating when the AVS completion could be expected. Assistant Director Leandro Reasoner, RN informed of transaction.

## 2017-02-19 NOTE — Progress Notes (Signed)
Patient informed that RN had spoken with MD and was waiting for completion of AVS and formal D/C order.

## 2017-02-19 NOTE — Discharge Instructions (Addendum)
INSTRUCTIONS AFTER JOINT REPLACEMENT  ° °o Remove items at home which could result in a fall. This includes throw rugs or furniture in walking pathways °o ICE to the affected joint every three hours while awake for 30 minutes at a time, for at least the first 3-5 days, and then as needed for pain and swelling.  Continue to use ice for pain and swelling. You may notice swelling that will progress down to the foot and ankle.  This is normal after surgery.  Elevate your leg when you are not up walking on it.   °o Continue to use the breathing machine you got in the hospital (incentive spirometer) which will help keep your temperature down.  It is common for your temperature to cycle up and down following surgery, especially at night when you are not up moving around and exerting yourself.  The breathing machine keeps your lungs expanded and your temperature down. ° ° °DIET:  As you were doing prior to hospitalization, we recommend a well-balanced diet. ° °DRESSING / WOUND CARE / SHOWERING ° °You may change your dressing 3-5 days after surgery.  Then change the dressing every day with sterile gauze.  Please use good hand washing techniques before changing the dressing.  Do not use any lotions or creams on the incision until instructed by your surgeon. ° °ACTIVITY ° °o Increase activity slowly as tolerated, but follow the weight bearing instructions below.   °o No driving for 6 weeks or until further direction given by your physician.  You cannot drive while taking narcotics.  °o No lifting or carrying greater than 10 lbs. until further directed by your surgeon. °o Avoid periods of inactivity such as sitting longer than an hour when not asleep. This helps prevent blood clots.  °o You may return to work once you are authorized by your doctor.  ° ° ° °WEIGHT BEARING  ° °Weight bearing as tolerated with assist device (walker, cane, etc) as directed, use it as long as suggested by your surgeon or therapist, typically at  least 4-6 weeks. ° ° °EXERCISES ° °Results after joint replacement surgery are often greatly improved when you follow the exercise, range of motion and muscle strengthening exercises prescribed by your doctor. Safety measures are also important to protect the joint from further injury. Any time any of these exercises cause you to have increased pain or swelling, decrease what you are doing until you are comfortable again and then slowly increase them. If you have problems or questions, call your caregiver or physical therapist for advice.  ° °Rehabilitation is important following a joint replacement. After just a few days of immobilization, the muscles of the leg can become weakened and shrink (atrophy).  These exercises are designed to build up the tone and strength of the thigh and leg muscles and to improve motion. Often times heat used for twenty to thirty minutes before working out will loosen up your tissues and help with improving the range of motion but do not use heat for the first two weeks following surgery (sometimes heat can increase post-operative swelling).  ° °These exercises can be done on a training (exercise) mat, on the floor, on a table or on a bed. Use whatever works the best and is most comfortable for you.    Use music or television while you are exercising so that the exercises are a pleasant break in your day. This will make your life better with the exercises acting as a break   in your routine that you can look forward to.   Perform all exercises about fifteen times, three times per day or as directed.  You should exercise both the operative leg and the other leg as well.  Do home exercises given by home health therapist.    A rehabilitation program following joint replacement surgery can speed recovery and prevent re-injury in the future due to weakened muscles. Contact your doctor or a physical therapist for more information on knee rehabilitation.    CONSTIPATION  Constipation  is defined medically as fewer than three stools per week and severe constipation as less than one stool per week.  Even if you have a regular bowel pattern at home, your normal regimen is likely to be disrupted due to multiple reasons following surgery.  Combination of anesthesia, postoperative narcotics, change in appetite and fluid intake all can affect your bowels.   YOU MUST use at least one of the following options; they are listed in order of increasing strength to get the job done.  They are all available over the counter, and you may need to use some, POSSIBLY even all of these options:    Drink plenty of fluids (prune juice may be helpful) and high fiber foods Colace 100 mg by mouth twice a day  Senokot for constipation as directed and as needed Dulcolax (bisacodyl), take with full glass of water  Miralax (polyethylene glycol) once or twice a day as needed.  If you have tried all these things and are unable to have a bowel movement in the first 3-4 days after surgery call either your surgeon or your primary doctor.    If you experience loose stools or diarrhea, hold the medications until you stool forms back up.  If your symptoms do not get better within 1 week or if they get worse, check with your doctor.  If you experience "the worst abdominal pain ever" or develop nausea or vomiting, please contact the office immediately for further recommendations for treatment.   ITCHING:  If you experience itching with your medications, try taking only a single pain pill, or even half a pain pill at a time.  You can also use Benadryl over the counter for itching or also to help with sleep.   TED HOSE STOCKINGS:  Use stockings on both legs until for at least 2 weeks or as directed by physician office. They may be removed at night for sleeping.  MEDICATIONS:  See your medication summary on the After Visit Summary that nursing will review with you.  You may have some home medications which will be  placed on hold until you complete the course of blood thinner medication.  It is important for you to complete the blood thinner medication as prescribed.  PRECAUTIONS:  If you experience chest pain or shortness of breath - call 911 immediately for transfer to the hospital emergency department.   If you develop a fever greater that 101 F, purulent drainage from wound, increased redness or drainage from wound, foul odor from the wound/dressing, or calf pain - CONTACT YOUR SURGEON.                                                   FOLLOW-UP APPOINTMENTS:  If you do not already have a post-op appointment, please call the office for an appointment to  be seen by your surgeon.  Guidelines for how soon to be seen are listed in your After Visit Summary, but are typically between 1-4 weeks after surgery.  OTHER INSTRUCTIONS:   Knee Replacement:  Do not place pillow under knee, focus on keeping the knee straight while resting. CPM instructions: 0-90 degrees, 2 hours in the morning, 2 hours in the afternoon, and 2 hours in the evening. Place foam block, curve side up under heel at all times except when in CPM or when walking.  DO NOT modify, tear, cut, or change the foam block in any way.  MAKE SURE YOU:   Understand these instructions.   Get help right away if you are not doing well or get worse.    Thank you for letting us be a part of your medical care team.  It is a privilege we respect greatly.  We hope these instructions will help you stay on track for a fast and full recovery!

## 2017-02-19 NOTE — Progress Notes (Signed)
Pt discharged from unit with family accompanying. All personal belongings with pt, d/c instructions and d/c rx reviewed. Pt demonstrates no s/sx of distress or c/o. DME with pt. Pt reminded of follow up appt with MD

## 2017-02-20 NOTE — Discharge Summary (Signed)
Patient ID: Anita Lewis MRN: [id number] DOB/AGE: 03/08/1946 71 y.o.  Admit date: 02/17/2017 Discharge date: 02/20/2017  Admission Diagnoses:  Active Problems:   Arthritis of right hip   Discharge Diagnoses:  Active Problems:   Arthritis of right hip  status post Procedure(s): TOTAL HIP ARTHROPLASTY ANTERIOR APPROACH  Past Medical History:  Diagnosis Date  . Arthritis   . Diabetes mellitus, type II (HCC)   . Gout   . Headache   . Hypertension     Surgeries: Procedure(s): TOTAL HIP ARTHROPLASTY ANTERIOR APPROACH on 02/17/2017   Consultants:   Discharged Condition: Improved  Hospital Course: Anita Lewis is an 71 y.o. female who was admitted 02/17/2017 for operative treatment of end stage DJD right hip. . Patient failed conservative treatments (please see the history and physical for the specifics) and had severe unremitting pain that affects sleep, daily activities and work/hobbies. After pre-op clearance, the patient was taken to the operating room on 02/17/2017 and underwent  Procedure(s): TOTAL HIP ARTHROPLASTY ANTERIOR APPROACH.    Patient was given perioperative antibiotics: Anti-infectives    Start     Dose/Rate Route Frequency Ordered Stop   02/17/17 1800  ceFAZolin (ANCEF) IVPB 1 g/50 mL premix     1 g 100 mL/hr over 30 Minutes Intravenous Every 8 hours 02/17/17 1703 02/18/17 0555   02/17/17 1100  ceFAZolin (ANCEF) IVPB 2g/100 mL premix     2 g 200 mL/hr over 30 Minutes Intravenous On call to O.R. 02/17/17 1024 02/17/17 1215   02/17/17 1033  ceFAZolin (ANCEF) 2-4 GM/100ML-% IVPB    Comments:  Block, Sarah   : cabinet override      02/17/17 1033 02/17/17 1200       Patient was given sequential compression devices and early ambulation to prevent DVT.   Patient benefited maximally from hospital stay and there were no complications. At the time of discharge, the patient was urinating/moving their bowels without difficulty, tolerating a regular diet, pain  is controlled with oral pain medications and they have been cleared by PT/OT.   Recent vital signs: No data found.    Recent laboratory studies:  Recent Labs  02/18/17 0612 02/18/17 1133 02/19/17 0451  WBC 9.5  --  12.0*  HGB 8.5* 8.9* 8.3*  HCT 28.3* 29.6* 27.3*  PLT 214  --  208  NA 138  --  135  K 4.4  --  4.6  CL 105  --  103  CO2 24  --  23  BUN 23*  --  24*  CREATININE 1.54*  --  1.71*  GLUCOSE 171*  --  137*  CALCIUM 8.5*  --  8.6*     Discharge Medications:   Allergies as of 02/19/2017      Reactions   No Known Allergies       Medication List    STOP taking these medications   acetaminophen 500 MG tablet Commonly known as:  TYLENOL   aspirin 81 MG tablet Replaced by:  aspirin 325 MG EC tablet     TAKE these medications   aspirin 325 MG EC tablet Take 1 tablet (325 mg total) by mouth daily. Replaces:  aspirin 81 MG tablet   B-D ULTRAFINE III SHORT PEN 31G X 8 MM Misc Generic drug:  Insulin Pen Needle USE ONE  AT BEDTIME   LANTUS SOLOSTAR 100 UNIT/ML Solostar Pen Generic drug:  Insulin Glargine Inject 20 Units into the skin at bedtime.   lisinopril-hydrochlorothiazide 10-12.5 MG  tablet Commonly known as:  PRINZIDE,ZESTORETIC Take 1 tablet by mouth daily.   metFORMIN 500 MG tablet Commonly known as:  GLUCOPHAGE TAKE ONE TABLET BY MOUTH TWICE DAILY   methocarbamol 500 MG tablet Commonly known as:  ROBAXIN Take 1 tablet (500 mg total) by mouth every 6 (six) hours as needed for muscle spasms.   metoprolol tartrate 25 MG tablet Commonly known as:  LOPRESSOR Take 12.5 mg by mouth 2 (two) times daily.   oxyCODONE-acetaminophen 5-325 MG tablet Commonly known as:  PERCOCET/ROXICET Take 1-2 tablets by mouth every 6 (six) hours as needed for severe pain.   VITAMIN D PO Take 1 tablet by mouth daily.       Diagnostic Studies: Dg Pelvis Portable  Result Date: 02/17/2017 CLINICAL DATA:  71 year old female with right hip replacement. Initial  encounter. EXAM: DG C-ARM 61-120 MIN; PORTABLE PELVIS 1-2 VIEWS COMPARISON:  None. FINDINGS: Four intraoperative C-arm views submitted for review after surgery. Right total hip replacement appears in satisfactory position on the images submitted. The inferior aspect of the femoral component is not imaged. Lateral view not obtained. IMPRESSION: Post right hip replacement. Electronically Signed   By: Lacy Duverney M.D.   On: 02/17/2017 14:49   Dg C-arm 61-120 Min  Result Date: 02/17/2017 CLINICAL DATA:  71 year old female with right hip replacement. Initial encounter. EXAM: DG C-ARM 61-120 MIN; PORTABLE PELVIS 1-2 VIEWS COMPARISON:  None. FINDINGS: Four intraoperative C-arm views submitted for review after surgery. Right total hip replacement appears in satisfactory position on the images submitted. The inferior aspect of the femoral component is not imaged. Lateral view not obtained. IMPRESSION: Post right hip replacement. Electronically Signed   By: Lacy Duverney M.D.   On: 02/17/2017 14:49   Dg Hip Operative Unilat With Pelvis Right  Result Date: 02/17/2017 CLINICAL DATA:  Total hip arthroplasty RIGHT, anterior approach EXAM: OPERATIVE RIGHT HIP (WITH PELVIS IF PERFORMED) 4 VIEWS TECHNIQUE: Fluoroscopic spot image(s) were submitted for interpretation post-operatively. FLUOROSCOPY TIME:  0 minutes 38 seconds new Images obtained: 4 COMPARISON:  None FINDINGS: Osseous demineralization. Osteoarthritic changes RIGHT hip joint. Placement of acetabular and femoral components of a RIGHT hip prosthesis. No fracture or bone destruction identified though the tip of the femoral component is not completely imaged on the submitted images. IMPRESSION: RIGHT hip prosthesis and osseous demineralization as above. Electronically Signed   By: Ulyses Southward M.D.   On: 02/17/2017 14:49      Follow-up Information    [facility] Follow up.   Why:  Someone from [facility] will contact you to arrange start date and  time for therapy. Contact information: [phone]       Eldred Manges, MD Follow up today.   Specialty:  Orthopedic Surgery Why:  need return office visit 2 weeks postop Contact information: [address] [place] [address] [phone]           Discharge Plan:  discharge to home  Disposition:     Signed: Zonia Kief for Annell Greening MD 02/20/2017, 4:36 PM

## 2017-02-24 NOTE — Telephone Encounter (Signed)
Patient is requesting a different pain medication, she says she is nauseous and just feels sick. She takes oxycodone and methogarbam ? [facility] [place], [place]  Cb#: [phone]

## 2017-02-24 NOTE — Telephone Encounter (Signed)
I called, she will switch to aleve and stop taking the pain meds multiple times a day, pain is only mild . FYI

## 2017-02-24 NOTE — Telephone Encounter (Signed)
noted 

## 2017-02-24 NOTE — Telephone Encounter (Signed)
Please advise 

## 2017-02-27 ENCOUNTER — Ambulatory Visit (INDEPENDENT_AMBULATORY_CARE_PROVIDER_SITE_OTHER): Payer: Medicare Other

## 2017-02-27 ENCOUNTER — Encounter (INDEPENDENT_AMBULATORY_CARE_PROVIDER_SITE_OTHER): Payer: Self-pay | Admitting: Orthopaedic Surgery

## 2017-02-27 ENCOUNTER — Ambulatory Visit (INDEPENDENT_AMBULATORY_CARE_PROVIDER_SITE_OTHER): Payer: Medicare Other | Admitting: Orthopaedic Surgery

## 2017-02-27 VITALS — BP 121/62 | HR 81 | Ht 67.0 in | Wt 204.0 lb

## 2017-02-27 DIAGNOSIS — Z96641 Presence of right artificial hip joint: Secondary | ICD-10-CM | POA: Insufficient documentation

## 2017-02-27 DIAGNOSIS — M1611 Unilateral primary osteoarthritis, right hip: Secondary | ICD-10-CM

## 2017-02-27 NOTE — Progress Notes (Signed)
   Post-Op Visit Note   Patient: Anita Lewis           Date of Birth: 1946-08-14           MRN: [id number] Visit Date: 02/27/2017 PCP: Louie Boston, MD   Assessment & Plan:  Chief Complaint:  Chief Complaint  Patient presents with  . Right Hip - Routine Post Op   Visit Diagnoses:  1. Primary osteoarthritis of right hip   2. S/P hip replacement, right     Plan: Office visit 5 weeks should continue with walking using her walker gradually progress to cane in the left hand. He is having some soreness in the left leg since she is been the partial weightbearing with the right hip postoperatively. Staples were harvested incision looks good she can remove the dressing to base multiplied dressing to prevent a closed rubbing on the incision.  Follow-Up Instructions: Return in about 5 weeks (around 04/03/2017).   Orders:  Orders Placed This Encounter  Procedures  . XR HIP UNILAT W OR W/O PELVIS 2-3 VIEWS RIGHT   No orders of the defined types were placed in this encounter.  HPI Patient returns for first post op visit status post right total hip arthroplasty on 02/17/2017. She is 10 days post op. She is ambulating without a walker today. Her incision looks good and staples are removed. She states that she is having increased pain in her left hip now.  She is taking aleve for pain as the pain meds made her sick.   Imaging: Xr Hip Unilat W Or W/o Pelvis 2-3 Views Right  Result Date: 02/27/2017 Postop x-rays including AP pelvis reviewed for the right hip good position alignment leg lengths are equal. Her graft  impression: Satisfactory postop right total hip arthroplasty.   PMFS History: Patient Active Problem List   Diagnosis Date Noted  . S/P hip replacement, right 02/27/2017  . Arthritis of right hip 02/17/2017  . Primary osteoarthritis of right hip 12/19/2016  . 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 Essential hypertension, benign 11/02/2015    . Hyperlipidemia 11/02/2015  . Morbid obesity due to excess calories (HCC) 11/02/2015   Past Medical History:  Diagnosis Date  . Arthritis   . Diabetes mellitus, type II (HCC)   . Gout   . Headache   . Hypertension     Family History  Problem Relation Age of Onset  . Cancer Mother     lung  . Hypertension Mother   . Hypertension Father     Past Surgical History:  Procedure Laterality Date  . NO PAST SURGERIES    . TOTAL HIP ARTHROPLASTY Right 02/17/2017   Procedure: TOTAL HIP ARTHROPLASTY ANTERIOR APPROACH;  Surgeon: Eldred Manges, MD;  Location: [facility] OR;  Service: Orthopedics;  Laterality: Right;  . TUBAL LIGATION     Social History   Occupational History  . Not on file.   Social History Main Topics  . Smoking status: Former Games developer  . Smokeless tobacco: Never Used  . Alcohol use No  . Drug use: No  . Sexual activity: Not on file

## 2017-03-20 ENCOUNTER — Other Ambulatory Visit: Payer: Self-pay | Admitting: "Endocrinology

## 2017-03-27 ENCOUNTER — Other Ambulatory Visit: Payer: Self-pay | Admitting: "Endocrinology

## 2017-04-03 ENCOUNTER — Ambulatory Visit (INDEPENDENT_AMBULATORY_CARE_PROVIDER_SITE_OTHER): Payer: Medicare Other | Admitting: Orthopaedic Surgery

## 2017-04-03 VITALS — BP 116/69 | HR 71 | Ht 67.0 in | Wt 198.0 lb

## 2017-04-03 DIAGNOSIS — M545 Low back pain, unspecified: Secondary | ICD-10-CM

## 2017-04-03 DIAGNOSIS — Z96641 Presence of right artificial hip joint: Secondary | ICD-10-CM

## 2017-04-03 MED ORDER — BUPIVACAINE HCL 0.5 % IJ SOLN
2.0000 mL | INTRAMUSCULAR | Status: AC | PRN
  Administered 2017-04-03: 2 mL

## 2017-04-03 MED ORDER — TRIAMCINOLONE ACETONIDE 40 MG/ML IJ SUSP
40.0000 mg | INTRAMUSCULAR | Status: AC | PRN
  Administered 2017-04-03: 40 mg via INTRAMUSCULAR

## 2017-04-03 NOTE — Progress Notes (Signed)
Office Visit Note   Patient: Anita Lewis           Date of Birth: 1946/05/06           MRN: [id number] Visit Date: 04/03/2017              Requested by: Oley Balm. Margo Common, MD [address] [place], [place] [address] PCP: Louie Boston, MD   Assessment & Plan: Visit Diagnoses:  1. Status post total replacement of right hip   2. Acute bilateral low back pain without sciatica         Patient has a new acute problem which is lumbar spasms likely related to previous central disc bulge from degeneration at L5-S1 with severe lumbar spasms. She cannot tolerate the pain and Percocet and hydrocodone have not been effective. She requested a cortisone injection which was performed. We will check her back again in 2 weeks to follow her up for acute spasms. If she has persistent symptoms or develops radicular symptoms we'll need to consider repeat MRI lumbar spine imaging.  Plan: Lumbar injection performed trigger point intermuscularly where she is having spasms total 3 mL. The injection is not related to total hip arthroplasty but is related to her acute development of severe excruciating back pain with lumbar spasms.  Follow-Up Instructions: Return in about 2 weeks (around 04/17/2017).   Orders:  Orders Placed This Encounter  Procedures  . Trigger Point Injection   No orders of the defined types were placed in this encounter.     Procedures: Trigger Point Inj Date/Time: 04/03/2017 9:32 AM Performed by: Eldred Manges Authorized by: Annell Greening C   Indications:  Muscle spasm and pain Total # of Trigger Points:  1 Location: back   Medications #1:  40 mg triamcinolone acetonide 40 MG/ML; 2 mL bupivacaine 0.5 % Patient tolerance:  Patient tolerated the procedure well with no immediate complications     Clinical Data: No additional findings.   Subjective: Chief Complaint  Patient presents with  . Right Hip - Routine Post Op  . Lower Back - Pain    Patient had right total hip  arthroplasty 02/17/2017 she been walking well and been walking in the house without any walking aids when she went out how she was using her cane. She's had sudden onset of severe low back pain at the lumbosacral junction with severe spasms is having trouble getting from sitting to standing. The pain she rates as 10 out of 10. Nociceptive bowel or bladder symptoms. She took Percocet that she had after the hip arthroplasty didn't work she went to the emergency room they did x-rays which showed narrowing of L5-S1 with facet arthropathy. They gave her some hydrocodone she's used this without nausea but states it still does not take care of the pain. She is a diabetic on pills and insulin. No associated bowel or bladder symptoms other than nausea with the pain medication. If she lays down she gets relief when she sits she can get comfortable so she starts to move she has recurrent spasms which occurred today in the exam room. 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  Patient has now had to ambulate with a walker and it took her about 10 minutes to get from sitting to standing position due to recurrent spasms in the lumbar spine.  Review of Systems 14 point review of systems is reviewed and updated from her surgery in  February and is unchanged other than as listed above with her new problem which is severe low back pain with spasms since 5 days ago.   Objective: Vital Signs: BP 116/69   Pulse 71   Ht 5\' 7"  (1.702 m)   Wt 198 lb (89.8 kg)   BMI 31.01 kg/m   Physical Exam  Constitutional: She is oriented to person, place, and time. She appears well-developed.  HENT:  Head: Normocephalic.  Right Ear: External ear normal.  Left Ear: External ear normal.  Eyes: Pupils are equal, round, and reactive to light.  Neck: No tracheal deviation present. No thyromegaly present.  Cardiovascular: Normal rate.   Pulmonary/Chest: Effort normal.    Abdominal: Soft.  Musculoskeletal:  Patient has a normal lumbar spine skin. No sciatic notch tenderness. Straight leg raising 90. Reflexes are intact negative popliteal compression test. Right direct anterior hip incision is well-healed. No fluctuance soft and the incision is well-healed. Negative Faber test no pain with internal/external rotation of her hips leg lengths are equal. Quad strength is good but she has spasms whenever she tries to move lumbosacral junction. Severe tenderness with palpation over spinous process L4-L5 and also over SI joints.  Neurological: She is alert and oriented to person, place, and time.  Skin: Skin is warm and dry.  Psychiatric: She has a normal mood and affect. Her behavior is normal.   distal pulses are intact. Anterior tib EHL is intact. No sensory deficit the lower extremities over the feet L5-S1 and L4 dermatomes. No calf tenderness. No inguinal lymphadenopathy.  Ortho Exam  Specialty Comments:  No specialty comments available.  Imaging: No results found.   PMFS History: Patient Active Problem List   Diagnosis Date Noted  . Status post total replacement of right hip 02/27/2017  . 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 Essential hypertension, benign 11/02/2015  . Hyperlipidemia 11/02/2015  . Morbid obesity due to excess calories (HCC) 11/02/2015   Past Medical History:  Diagnosis Date  . Arthritis   . Diabetes mellitus, type II (HCC)   . Gout   . Headache   . Hypertension     Family History  Problem Relation Age of Onset  . Cancer Mother     lung  . Hypertension Mother   . Hypertension Father     Past Surgical History:  Procedure Laterality Date  . NO PAST SURGERIES    . TOTAL HIP ARTHROPLASTY Right 02/17/2017   Procedure: TOTAL HIP ARTHROPLASTY ANTERIOR APPROACH;  Surgeon: 02/19/2017, MD;  Location: [facility] OR;  Service: Orthopedics;  Laterality: Right;  . TUBAL LIGATION     Social History    Occupational History  . Not on file.   Social History Main Topics  . Smoking status: Former Eldred Manges  . Smokeless tobacco: Never Used  . Alcohol use No  . Drug use: No  . Sexual activity: Not on file

## 2017-04-17 ENCOUNTER — Ambulatory Visit (INDEPENDENT_AMBULATORY_CARE_PROVIDER_SITE_OTHER): Payer: Medicare Other | Admitting: Orthopaedic Surgery

## 2017-04-17 ENCOUNTER — Encounter (INDEPENDENT_AMBULATORY_CARE_PROVIDER_SITE_OTHER): Payer: Self-pay | Admitting: Orthopaedic Surgery

## 2017-04-17 VITALS — BP 120/73 | HR 74 | Ht 67.0 in | Wt 198.0 lb

## 2017-04-17 DIAGNOSIS — Z96641 Presence of right artificial hip joint: Secondary | ICD-10-CM

## 2017-04-17 NOTE — Progress Notes (Signed)
   Post-Op Visit Note   Patient: Anita Lewis           Date of Birth: 12-Oct-1946           MRN: [id number] Visit Date: 04/17/2017 PCP: Louie Boston, MD   Assessment & Plan:post right THA doing well. No triggering post A1 pulley  Release.   Chief Complaint:  Chief Complaint  Patient presents with  . Right Hip - Routine Post Op  . Lower Back - Pain   Visit Diagnoses:  1. Status post total replacement of right hip     Plan: Patient stepped with the surgical result for right total hip arthroplasty. No triggering or finger posterior finger release. She'll continue to work on strengthening of her hip she has a little bit of soreness at the proximal portion of the incision. She still doing some exercises to strengthen her quad. She continues to improve her strength she should be able to wean off her cane and she can resume driving now.  Follow-Up Instructions: No Follow-up on file.   Orders:  No orders of the defined types were placed in this encounter.  No orders of the defined types were placed in this encounter.   Imaging: No results found.  PMFS History: Patient Active Problem List   Diagnosis Date Noted  . Status post total replacement of right hip 02/27/2017  . 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 Essential hypertension, benign 11/02/2015  . Hyperlipidemia 11/02/2015  . Morbid obesity due to excess calories (HCC) 11/02/2015   Past Medical History:  Diagnosis Date  . Arthritis   . Diabetes mellitus, type II (HCC)   . Gout   . Headache   . Hypertension     Family History  Problem Relation Age of Onset  . Cancer Mother     lung  . Hypertension Mother   . Hypertension Father     Past Surgical History:  Procedure Laterality Date  . NO PAST SURGERIES    . TOTAL HIP ARTHROPLASTY Right 02/17/2017   Procedure: TOTAL HIP ARTHROPLASTY ANTERIOR APPROACH;  Surgeon: Eldred Manges, MD;  Location: [facility] OR;  Service: Orthopedics;   Laterality: Right;  . TUBAL LIGATION     Social History   Occupational History  . Not on file.   Social History Main Topics  . Smoking status: Former Games developer  . Smokeless tobacco: Never Used  . Alcohol use No  . Drug use: No  . Sexual activity: Not on file

## 2017-05-07 ENCOUNTER — Telehealth (INDEPENDENT_AMBULATORY_CARE_PROVIDER_SITE_OTHER): Payer: Self-pay | Admitting: *Deleted

## 2017-05-07 NOTE — Telephone Encounter (Signed)
Velna Hatchet from Dr. Marcelle Smiling office (Dentist) called wants to know if pt needs to be premedicated for any dental procedures, pt just had hip sx in Feb and wants to take precautions if there are any. Please advise!  C/B [phone]

## 2017-05-07 NOTE — Telephone Encounter (Signed)
IC Velna Hatchet and advised per Zonia Kief, Amoxicillin 2 gm po 1 hr prior to exam.

## 2017-11-22 ENCOUNTER — Emergency Department (HOSPITAL_COMMUNITY): Payer: Medicare Other

## 2017-11-22 ENCOUNTER — Other Ambulatory Visit: Payer: Self-pay

## 2017-11-22 ENCOUNTER — Encounter (HOSPITAL_COMMUNITY): Payer: Self-pay

## 2017-11-22 ENCOUNTER — Emergency Department (HOSPITAL_COMMUNITY)
Admission: EM | Admit: 2017-11-22 | Discharge: 2017-11-22 | Disposition: A | Discharge status: Home / Self Care | Payer: Medicare Other | Attending: Emergency Medicine | Admitting: Emergency Medicine

## 2017-11-22 DIAGNOSIS — Z794 Long term (current) use of insulin: Secondary | ICD-10-CM | POA: Diagnosis not present

## 2017-11-22 DIAGNOSIS — Z7982 Long term (current) use of aspirin: Secondary | ICD-10-CM | POA: Insufficient documentation

## 2017-11-22 DIAGNOSIS — I129 Hypertensive chronic kidney disease with stage 1 through stage 4 chronic kidney disease, or unspecified chronic kidney disease: Secondary | ICD-10-CM | POA: Diagnosis not present

## 2017-11-22 DIAGNOSIS — Z96641 Presence of right artificial hip joint: Secondary | ICD-10-CM | POA: Insufficient documentation

## 2017-11-22 DIAGNOSIS — N181 Chronic kidney disease, stage 1: Secondary | ICD-10-CM | POA: Diagnosis not present

## 2017-11-22 DIAGNOSIS — Z79899 Other long term (current) drug therapy: Secondary | ICD-10-CM | POA: Diagnosis not present

## 2017-11-22 DIAGNOSIS — Z87891 Personal history of nicotine dependence: Secondary | ICD-10-CM | POA: Diagnosis not present

## 2017-11-22 DIAGNOSIS — R109 Unspecified abdominal pain: Secondary | ICD-10-CM

## 2017-11-22 DIAGNOSIS — E1122 Type 2 diabetes mellitus with diabetic chronic kidney disease: Secondary | ICD-10-CM | POA: Diagnosis not present

## 2017-11-22 LAB — URINALYSIS, ROUTINE W REFLEX MICROSCOPIC
Bilirubin Urine: NEGATIVE
GLUCOSE, UA: NEGATIVE mg/dL
HGB URINE DIPSTICK: NEGATIVE
Ketones, ur: NEGATIVE mg/dL
LEUKOCYTES UA: NEGATIVE
Nitrite: NEGATIVE
PH: 7 (ref 5.0–8.0)
Protein, ur: NEGATIVE mg/dL
Specific Gravity, Urine: 1.013 (ref 1.005–1.030)

## 2017-11-22 MED ORDER — HYDROMORPHONE HCL 1 MG/ML IJ SOLN
1.0000 mg | Freq: Once | INTRAMUSCULAR | Status: AC
  Administered 2017-11-22: 1 mg via INTRAMUSCULAR
  Filled 2017-11-22: qty 1

## 2017-11-22 MED ORDER — HYDROCODONE-ACETAMINOPHEN 5-325 MG PO TABS
2.0000 | ORAL_TABLET | Freq: Once | ORAL | Status: AC
  Administered 2017-11-22: 2 via ORAL
  Filled 2017-11-22: qty 2

## 2017-11-22 NOTE — ED Triage Notes (Signed)
Onset 2 days ago lower right back pain, pain is worsening.  No known injury.

## 2017-11-22 NOTE — ED Provider Notes (Signed)
[facility] EMERGENCY DEPARTMENT Provider Note   CSN: [id number] Arrival date & time: 11/22/17  1547     History   Chief Complaint Chief Complaint  Patient presents with  . Back Pain    HPI MAUDRY ZEIDAN is a 71 y.o. female.  Patient presents with right lower back pain worse with movement for 2 days.No history of similar. No recent falls or injuries. No weakness or numbness in lower extremities. No urinary symptoms or history of kidney stone. Patient was given nonsteroidals, narcotics and benzos however these are not helping her.      Past Medical History:  Diagnosis Date  . Arthritis   . Diabetes mellitus, type II (HCC)   . Gout   . Headache   . Hypertension     Patient Active Problem List   Diagnosis Date Noted  . Status post total replacement of right hip 02/27/2017  . 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 Essential hypertension, benign 11/02/2015  . Hyperlipidemia 11/02/2015  . Morbid obesity due to excess calories (HCC) 11/02/2015    Past Surgical History:  Procedure Laterality Date  . NO PAST SURGERIES    . TOTAL HIP ARTHROPLASTY Right 02/17/2017   Procedure: TOTAL HIP ARTHROPLASTY ANTERIOR APPROACH;  Surgeon: Eldred Manges, MD;  Location: [facility] OR;  Service: Orthopedics;  Laterality: Right;  . TUBAL LIGATION      OB History    No data available       Home Medications    Prior to Admission medications   Medication Sig Start Date End Date Taking? Authorizing Provider  aspirin 325 MG EC tablet Take 1 tablet (325 mg total) by mouth daily. 02/19/17   Naida Sleight, PA-C  B-D ULTRAFINE III SHORT PEN 31G X 8 MM MISC USE ONE  AT BEDTIME 02/16/16   Roma Kayser, MD  Cholecalciferol (VITAMIN D PO) Take 1 tablet by mouth daily.    [provider]  HYDROcodone-acetaminophen (NORCO/VICODIN) 5-325 MG tablet  04/02/17   [provider]  Insulin Glargine (LANTUS SOLOSTAR) 100 UNIT/ML  Solostar Pen Inject 20 Units into the skin at bedtime.     [provider]  lisinopril-hydrochlorothiazide (PRINZIDE,ZESTORETIC) 10-12.5 MG tablet Take 1 tablet by mouth daily.    [provider]  metFORMIN (GLUCOPHAGE) 500 MG tablet TAKE ONE TABLET BY MOUTH TWICE DAILY 02/16/16   Nida, Denman George, MD  methocarbamol (ROBAXIN) 500 MG tablet Take 1 tablet (500 mg total) by mouth every 6 (six) hours as needed for muscle spasms. Patient not taking: Reported on 02/27/2017 02/19/17   Naida Sleight, PA-C  metoprolol tartrate (LOPRESSOR) 25 MG tablet Take 12.5 mg by mouth 2 (two) times daily.     [provider]  oxyCODONE-acetaminophen (PERCOCET/ROXICET) 5-325 MG tablet Take 1-2 tablets by mouth every 6 (six) hours as needed for severe pain. Patient not taking: Reported on 02/27/2017 02/19/17   Naida Sleight, PA-C  ranitidine (ZANTAC) 300 MG tablet  03/12/17   [provider]    Family History Family History  Problem Relation Age of Onset  . Cancer Mother        lung  . Hypertension Mother   . Hypertension Father     Social History Social History   Tobacco Use  . Smoking status: Former Games developer  . Smokeless tobacco: Never Used  Substance Use Topics  . Alcohol use: No  . Drug use: No     Allergies  No known allergies   Review of Systems Review of Systems  Constitutional: Negative for chills and fever.  HENT: Negative for congestion.   Eyes: Negative for visual disturbance.  Respiratory: Negative for shortness of breath.   Cardiovascular: Negative for chest pain.  Gastrointestinal: Negative for abdominal pain and vomiting.  Genitourinary: Positive for flank pain. Negative for dysuria.  Musculoskeletal: Positive for back pain. Negative for neck pain and neck stiffness.  Skin: Negative for rash.  Neurological: Negative for light-headedness and headaches.     Physical Exam Updated Vital Signs BP 123/70 (BP Location: Left Arm)   Pulse 91    Temp 99 F (37.2 C) (Oral)   Resp 16   SpO2 92%   Physical Exam  Constitutional: She is oriented to person, place, and time. She appears well-developed and well-nourished.  HENT:  Head: Normocephalic and atraumatic.  Eyes: Conjunctivae are normal. Right eye exhibits no discharge. Left eye exhibits no discharge.  Neck: Normal range of motion. Neck supple. No tracheal deviation present.  Cardiovascular: Normal rate.  Pulmonary/Chest: Effort normal.  Abdominal: Soft. She exhibits no distension. There is no tenderness. There is no guarding.  Musculoskeletal: She exhibits tenderness. She exhibits no edema.  Patient has focal tenderness lumbosacral junction on the right. Pain with flexion of the right extremity without radiation of pain. Patient has 5+ strength of flexion extension major joints lower extremities. Sensation intact palpation bilateral.  Neurological: She is alert and oriented to person, place, and time.  Skin: Skin is warm. No rash noted.  Psychiatric: She has a normal mood and affect.  Nursing note and vitals reviewed.    ED Treatments / Results  Labs (all labs ordered are listed, but only abnormal results are displayed) Labs Reviewed  URINALYSIS, ROUTINE W REFLEX MICROSCOPIC    EKG  EKG Interpretation None       Radiology Ct Renal Stone Study  Result Date: 11/22/2017 CLINICAL DATA:  Right-sided flank pain radiating to hip for past 3 days. EXAM: CT ABDOMEN AND PELVIS WITHOUT CONTRAST TECHNIQUE: Multidetector CT imaging of the abdomen and pelvis was performed following the standard protocol without IV contrast. COMPARISON:  None. FINDINGS: Lower chest: No acute findings. Hepatobiliary: No mass visualized on this unenhanced exam. Gallbladder is unremarkable. Pancreas: No mass or inflammatory process visualized on this unenhanced exam. Spleen:  Within normal limits in size. Adrenals/Urinary tract: No evidence of urolithiasis or hydronephrosis. Unremarkable unopacified  urinary bladder. Stomach/Bowel: Tiny hiatal hernia. No evidence of obstruction, inflammatory process, or abnormal fluid collections. Normal appendix visualized. Diverticulosis is seen predominately involving the descending colon, however there is no evidence of acute diverticulitis. Vascular/Lymphatic: No pathologically enlarged lymph nodes identified. No evidence of abdominal aortic aneurysm. Aortic atherosclerosis. Reproductive: Enlarged uterus with lobulated contour, consistent with uterine fibroids. No adnexal mass or free fluid identified. Other:  Small bilateral inguinal hernias containing only fat. Musculoskeletal: No suspicious bone lesions identified. Bipolar right hip prosthesis in expected position. Moderate to severe degenerative disc disease at L5-S1. IMPRESSION: No evidence of urolithiasis, hydronephrosis, or other acute findings. Colonic diverticulosis, without radiographic evidence of diverticulitis. Tiny hiatal hernia. Uterine fibroids. Small bilateral fat-containing inguinal hernias. Electronically Signed   By: Myles Rosenthal M.D.   On: 11/22/2017 20:24    Procedures Procedures (including critical care time)  Medications Ordered in ED Medications  HYDROmorphone (DILAUDID) injection 1 mg (1 mg Intramuscular Given 11/22/17 1840)     Initial Impression / Assessment and Plan / ED Course  I have reviewed the triage  vital signs and the nursing notes.  Pertinent labs & imaging results that were available during my care of the patient were reviewed by me and considered in my medical decision making (see chart for details).    Patient presents with clinically muscular skull back pain however with worsening symptoms and age plan for screening CT stone study to look for other pathology. Pain meds ordered and patient has orthopedic follow-up this week.  CT no acute findings. Pain meds given in the ER. Results and differential diagnosis were discussed with the patient/parent/guardian. Xrays  were independently reviewed by myself.  Close follow up outpatient was discussed, comfortable with the plan.   Medications  HYDROmorphone (DILAUDID) injection 1 mg (1 mg Intramuscular Given 11/22/17 1840)    Vitals:   11/22/17 1618 11/22/17 2012  BP: (!) 145/74 123/70  Pulse: 79 91  Resp: 16 16  Temp: 99 F (37.2 C)   TempSrc: Oral   SpO2: 100% 92%    Final diagnoses:  Acute right flank pain     Final Clinical Impressions(s) / ED Diagnoses   Final diagnoses:  Acute right flank pain    ED Discharge Orders    None       Blane OharaZavitz, Chiana Wamser, MD 11/22/17 2051

## 2017-11-22 NOTE — Discharge Instructions (Signed)
Take pain meds and muscle relaxants as previously prescribed. Follow-up with orthopedic doctor as previously arranged.

## 2017-11-22 NOTE — ED Notes (Signed)
Pt and family understood dc material. NAD noted 

## 2017-11-24 ENCOUNTER — Telehealth (INDEPENDENT_AMBULATORY_CARE_PROVIDER_SITE_OTHER): Payer: Self-pay | Admitting: Orthopaedic Surgery

## 2017-11-24 NOTE — Telephone Encounter (Signed)
Sent this to you in error.  Sorry.  Will you send back please?

## 2017-11-24 NOTE — Telephone Encounter (Signed)
Back to U.

## 2017-11-24 NOTE — Telephone Encounter (Signed)
Is this ok to do?  Did not want to do without approval straight from you.  

## 2017-11-24 NOTE — Telephone Encounter (Signed)
Anita Lewis,Anita Lewis  06/01/1946  Pt stated she is in a lot of pain has appt with Dr.Yates on Thursday and she would like to see him earlier than Thursday. I tried to sched pt for 11/26/2017 @9  am and pt stated she is in a lot of pain. I told the  pt I would let you guys know. Pt stated she was seen by the ER and nothing is working

## 2017-11-25 NOTE — Telephone Encounter (Signed)
No earlier appts, no cancellations to move her to.

## 2017-11-27 ENCOUNTER — Encounter (INDEPENDENT_AMBULATORY_CARE_PROVIDER_SITE_OTHER): Payer: Self-pay | Admitting: Orthopaedic Surgery

## 2017-11-27 ENCOUNTER — Ambulatory Visit (INDEPENDENT_AMBULATORY_CARE_PROVIDER_SITE_OTHER): Payer: Medicare Other | Admitting: Orthopaedic Surgery

## 2017-11-27 VITALS — BP 134/82 | HR 74 | Ht 67.0 in | Wt 198.0 lb

## 2017-11-27 DIAGNOSIS — Z96641 Presence of right artificial hip joint: Secondary | ICD-10-CM

## 2017-11-27 DIAGNOSIS — M5441 Lumbago with sciatica, right side: Secondary | ICD-10-CM | POA: Diagnosis not present

## 2017-11-27 DIAGNOSIS — G8929 Other chronic pain: Secondary | ICD-10-CM | POA: Diagnosis not present

## 2017-11-27 NOTE — Progress Notes (Signed)
Office Visit Note   Patient: Anita Lewis           Date of Birth: 07/17/1946           MRN: [id number] Visit Date: 11/27/2017              Requested by: Louie Boston., MD [address] [place], [place] [address] PCP: Louie Boston., MD   Assessment & Plan: Visit Diagnoses:  1. Chronic right-sided low back pain with right-sided sciatica     Plan: The patient rates her pain is severe.  Quality walking has taken different narcotic pain medication and also muscle relaxants without relief.  Previous abdominal CT showed significant disc degeneration at L5-S1.  We will proceed with lumbar MRI scan to rule out disc herniation on the right side likely at L5-S1.  Follow-Up Instructions: Office follow-up after MRI scan lumbar rule out HNP right L5-S1  Orders:  No orders of the defined types were placed in this encounter.  No orders of the defined types were placed in this encounter.     Procedures: No procedures performed   Clinical Data: No additional findings.   Subjective: Chief Complaint  Patient presents with  . Lower Back - Pain    HPI 71 year old female complaining of severe back pain and right leg pain.  Emergency room 3 times in the last week she has severe pain with sitting.  Rarely has pain with standing.  States she has pain when she flexes her right hip.  Gets some relief with supine position.  Hydrocodone, oxycodone Robaxin.  States the pain radiates down into her right leg foot.  Diabetic on insulin type II diabetic.  She also takes metformin previous CT scan 11/22/2017 abdomen and pelvis showed severe L5-S1 disc degeneration.  Also right total  hip prosthesis.  She had lumbar trigger point injection in April 2018 when she was having severe back pain and spasms.  Review of Systems previous right total hip arthroplasty 02/17/2017 anterior approach.  Type 2 insulin-dependent diabetes with chronic kidney disease.  Hypertension, morbid obesity due to excess  calories.  Hyperlipidemia.   Objective: Vital Signs: BP 134/82   Pulse 74   Ht 5\' 7"  (1.702 m)   Wt 198 lb (89.8 kg)   BMI 31.01 kg/m   Physical Exam  Constitutional: She is oriented to person, place, and time. She appears well-developed.  HENT:  Head: Normocephalic.  Right Ear: External ear normal.  Left Ear: External ear normal.  Eyes: Pupils are equal, round, and reactive to light.  Neck: No tracheal deviation present. No thyromegaly present.  Cardiovascular: Normal rate.  Pulmonary/Chest: Effort normal.  Abdominal: Soft.  Neurological: She is alert and oriented to person, place, and time.  Skin: Skin is warm and dry.  Psychiatric: She has a normal mood and affect. Her behavior is normal.    Ortho Exam patient has well-healed right anterior total hip arthroplasty incision.  She has pain with straight leg raising at 90 degrees on the right negative on the left.  No pain with hip range of motion right or left negative SI joint testing.  Tenderness over the lumbar spine severe right sciatic notch tenderness.  She is able to heel and toe walk anterior tib intact.  Trace reflex Achilles on the right 2+ on the left.  2+ knee jerk right and left.  Distal pulses are 2+.  No quad weakness.  Adductors are strong.  Specialty Comments:  No specialty comments  available.  Imaging: No results found.   PMFS History: Patient Active Problem List   Diagnosis Date Noted  . Status post total replacement of right hip 02/27/2017  . 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 Essential hypertension, benign 11/02/2015  . Hyperlipidemia 11/02/2015  . Morbid obesity due to excess calories (HCC) 11/02/2015   Past Medical History:  Diagnosis Date  . Arthritis   . Diabetes mellitus, type II (HCC)   . Gout   . Headache   . Hypertension     Family History  Problem Relation Age of Onset  . Cancer Mother        lung  . Hypertension Mother   . Hypertension  Father     Past Surgical History:  Procedure Laterality Date  . NO PAST SURGERIES    . TOTAL HIP ARTHROPLASTY Right 02/17/2017   Procedure: TOTAL HIP ARTHROPLASTY ANTERIOR APPROACH;  Surgeon: Eldred Manges, MD;  Location: [facility] OR;  Service: Orthopedics;  Laterality: Right;  . TUBAL LIGATION     Social History   Occupational History  . Not on file  Tobacco Use  . Smoking status: Former Games developer  . Smokeless tobacco: Never Used  Substance and Sexual Activity  . Alcohol use: No  . Drug use: No  . Sexual activity: Not on file

## 2017-11-28 ENCOUNTER — Telehealth (INDEPENDENT_AMBULATORY_CARE_PROVIDER_SITE_OTHER): Payer: Self-pay | Admitting: Radiology

## 2017-11-28 DIAGNOSIS — M545 Low back pain: Secondary | ICD-10-CM

## 2017-11-28 NOTE — Telephone Encounter (Signed)
Order entered

## 2017-11-28 NOTE — Telephone Encounter (Signed)
Patient is having MRI Lumbar Spine at [facility] this morning. She would like to know if you could call her with the results?  Thanks.

## 2017-11-28 NOTE — Addendum Note (Signed)
Addended by: Rogers Seeds on: 11/28/2017 05:28 PM   Modules accepted: Orders

## 2017-11-28 NOTE — Telephone Encounter (Signed)
I called patient and reviewed MRI,  set her up  For ESI with [facility]  thanks

## 2017-12-01 ENCOUNTER — Telehealth (INDEPENDENT_AMBULATORY_CARE_PROVIDER_SITE_OTHER): Payer: Self-pay | Admitting: Orthopaedic Surgery

## 2017-12-01 NOTE — Telephone Encounter (Signed)
Already done

## 2017-12-01 NOTE — Telephone Encounter (Signed)
This one is in the [facility], whichever of [place] can get it first, thanks.

## 2017-12-01 NOTE — Telephone Encounter (Signed)
Pt needs to be sched for a shot. Pt wasn't sure if Dr.Newrton would be giving the shot.I tried to sched pt appt im not sure what she needs pertaining to the shot.Pt stated she had her MRI Friday

## 2017-12-02 ENCOUNTER — Encounter (INDEPENDENT_AMBULATORY_CARE_PROVIDER_SITE_OTHER): Payer: Self-pay | Admitting: Orthopaedic Surgery

## 2017-12-02 DIAGNOSIS — G8929 Other chronic pain: Secondary | ICD-10-CM | POA: Insufficient documentation

## 2017-12-02 DIAGNOSIS — M5441 Lumbago with sciatica, right side: Principal | ICD-10-CM

## 2017-12-04 ENCOUNTER — Encounter (INDEPENDENT_AMBULATORY_CARE_PROVIDER_SITE_OTHER): Payer: Self-pay | Admitting: Physical Medicine and Rehabilitation

## 2017-12-04 ENCOUNTER — Ambulatory Visit (INDEPENDENT_AMBULATORY_CARE_PROVIDER_SITE_OTHER): Payer: Medicare Other | Admitting: Physical Medicine and Rehabilitation

## 2017-12-04 ENCOUNTER — Ambulatory Visit (INDEPENDENT_AMBULATORY_CARE_PROVIDER_SITE_OTHER): Payer: Medicare Other

## 2017-12-04 VITALS — BP 119/67 | HR 108

## 2017-12-04 DIAGNOSIS — M5416 Radiculopathy, lumbar region: Secondary | ICD-10-CM

## 2017-12-04 MED ORDER — METHYLPREDNISOLONE ACETATE 80 MG/ML IJ SUSP
80.0000 mg | Freq: Once | INTRAMUSCULAR | Status: AC
  Administered 2017-12-04: 80 mg

## 2017-12-04 MED ORDER — LIDOCAINE HCL (PF) 1 % IJ SOLN
2.0000 mL | Freq: Once | INTRAMUSCULAR | Status: AC
Start: 2017-12-04 — End: 2017-12-04
  Administered 2017-12-04: 2 mL

## 2017-12-04 NOTE — Progress Notes (Deleted)
Patient is a 71 y.o female who comes in today to have an injection. Pain in lower back but mainly on the right side. Pain level (10/10).Patient states she has been having pain for three weeks and it is getting worse. Nothing seems to help. Difficult sleeping at night. Difficult to sit and stand back up with out having pain.

## 2017-12-04 NOTE — Patient Instructions (Signed)

## 2017-12-11 ENCOUNTER — Encounter (INDEPENDENT_AMBULATORY_CARE_PROVIDER_SITE_OTHER): Payer: Medicare Other | Admitting: Physical Medicine and Rehabilitation

## 2017-12-11 NOTE — Procedures (Signed)
[name] is a 71 year old female status post total hip replacement in February of this year on the right from an anterior approach.  She is followed by Dr. Ophelia Charter.  She has been having worsening severe right low back and posterior lateral hip and leg pain.  She reports her pain is 10 out of 10.  She has had various opioids as well as muscle relaxers with no help at all.  She has had updated MRI which is reviewed below and does show a small right paracentral protrusion at L5-S1 on top of a broad bulge.  We are going to complete a diagnostic and therapeutic right L5-S1 interlaminar epidural steroid injection.  Lumbar Epidural Steroid Injection - Interlaminar Approach with Fluoroscopic Guidance  Patient: Anita Lewis      Date of Birth: 1946/03/18 MRN: [id number] PCP: Louie Boston., MD      Visit Date: 12/04/2017   Universal Protocol:     Consent Given By: the patient  Position: PRONE  Additional Comments: Vital signs were monitored before and after the procedure. Patient was prepped and draped in the usual sterile fashion. The correct patient, procedure, and site was verified.   Injection Procedure Details:  Procedure Site One Meds Administered:  Meds ordered this encounter  Medications  . lidocaine (PF) (XYLOCAINE) 1 % injection 2 mL  . methylPREDNISolone acetate (DEPO-MEDROL) injection 80 mg     Laterality: Right  Location/Site:  L5-S1  Needle size: 20 G  Needle type: Tuohy  Needle Placement: Paramedian epidural  Findings:   -Comments: Excellent flow of contrast into the epidural space.  Procedure Details: Using a paramedian approach from the side mentioned above, the region overlying the inferior lamina was localized under fluoroscopic visualization and the soft tissues overlying this structure were infiltrated with 4 ml. of 1% Lidocaine without Epinephrine. The Tuohy needle was inserted into the epidural space using a paramedian approach.   The epidural space was  localized using loss of resistance along with lateral and bi-planar fluoroscopic views.  After negative aspirate for air, blood, and CSF, a 2 ml. volume of Isovue-250 was injected into the epidural space and the flow of contrast was observed. Radiographs were obtained for documentation purposes.    The injectate was administered into the level noted above.   Additional Comments:  The patient tolerated the procedure well Dressing: Band-Aid    Post-procedure details: Patient was observed during the procedure. Post-procedure instructions were reviewed.  Patient left the clinic in stable condition.  Pertinent Imaging: MRI LUMBAR SPINE WITHOUT CONTRAST  TECHNIQUE: Multiplanar, multisequence MR imaging of the lumbar spine was performed. No intravenous contrast was administered.  COMPARISON: 12/08/2014  FINDINGS: Segmentation: Standard.  Alignment: Physiologic.  Vertebrae: No fracture, evidence of discitis, or bone lesion.  Conus medullaris and cauda equina: Conus extends to the T12-L1 level. Conus and cauda equina appear normal.  Paraspinal and other soft tissues: No paraspinal abnormality.  Disc levels:  Disc spaces: Degenerative disc disease with disc height loss at L5-S1. Reactive endplate changes are noted.  T12-L1: No significant disc bulge. No evidence of neural foraminal stenosis. No central canal stenosis.  L1-L2: No significant disc bulge. No evidence of neural foraminal stenosis. No central canal stenosis.  L2-L3: Mild broad-based disc bulge. Mild bilateral facet arthropathy. No evidence of neural foraminal stenosis. No central canal stenosis.  L3-L4: Mild broad-based disc bulge. Mild bilateral facet arthropathy. No evidence of neural foraminal stenosis. No central canal stenosis.  L4-L5: Mild broad-based disc bulge. Mild bilateral  facet arthropathy. Bilateral facet arthropathy. No evidence of neural foraminal stenosis. No central canal  stenosis.  L5-S1: Broad-based disc bulge with a small right paracentral disc protrusion contacting the right intraspinal S1 nerve root. Mild bilateral foraminal narrowing. No central canal stenosis.  IMPRESSION: 1. At L5-S1 there is a broad-based disc bulge with a small right paracentral disc protrusion contacting the right intraspinal S1 nerve root. Mild bilateral foraminal narrowing. 2. At L4-5 there is a mild broad-based disc bulge. Mild bilateral facet arthropathy. Bilateral facet arthropathy.   Electronically Signed By: Elige Ko On: 11/28/2017 10:53

## 2018-01-01 ENCOUNTER — Encounter (INDEPENDENT_AMBULATORY_CARE_PROVIDER_SITE_OTHER): Payer: Self-pay | Admitting: Orthopaedic Surgery

## 2018-01-01 ENCOUNTER — Ambulatory Visit (INDEPENDENT_AMBULATORY_CARE_PROVIDER_SITE_OTHER): Payer: Medicare Other | Admitting: Orthopaedic Surgery

## 2018-01-01 ENCOUNTER — Ambulatory Visit (INDEPENDENT_AMBULATORY_CARE_PROVIDER_SITE_OTHER): Payer: Medicare Other

## 2018-01-01 VITALS — BP 91/46 | HR 81 | Ht 67.0 in | Wt 198.0 lb

## 2018-01-01 DIAGNOSIS — M25512 Pain in left shoulder: Secondary | ICD-10-CM | POA: Diagnosis not present

## 2018-01-01 DIAGNOSIS — M7542 Impingement syndrome of left shoulder: Secondary | ICD-10-CM | POA: Diagnosis not present

## 2018-01-01 NOTE — Progress Notes (Addendum)
Office Visit Note   Patient: Anita Lewis           Date of Birth: Mar 28, 1946           MRN: [id number] Visit Date: 01/01/2018              Requested by: Louie Boston., MD [address] [place], [place] [address] PCP: Louie Boston., MD   Assessment & Plan: Visit Diagnoses:  1. Acute pain of left shoulder   2. Impingement syndrome of left shoulder     Plan: Section performed with improvement in her left shoulder symptoms.  She has persistent problems she will let us know we can consider diagnostic imaging.  Follow-Up Instructions: Return if symptoms worsen or fail to improve.   Orders:  Orders Placed This Encounter  Procedures  . Large Joint Inj: L subacromial bursa  . XR Shoulder Left   Meds ordered this encounter  Medications  . bupivacaine (MARCAINE) 0.25 % (with pres) injection 4 mL  . lidocaine (XYLOCAINE) 1 % (with pres) injection 0.5 mL  . methylPREDNISolone acetate (DEPO-MEDROL) injection 40 mg      Procedures: Large Joint Inj: L subacromial bursa on 01/01/2018 9:54 AM Indications: pain Details: 22 G 1.5 in needle  Arthrogram: No  Medications: 4 mL bupivacaine 0.25 %; 40 mg methylPREDNISolone acetate 40 MG/ML; 0.5 mL lidocaine 1 % Outcome: tolerated well, no immediate complications Procedure, treatment alternatives, risks and benefits explained, specific risks discussed. Consent was given by the patient. Immediately prior to procedure a time out was called to verify the correct patient, procedure, equipment, support staff and site/side marked as required. Patient was prepped and draped in the usual sterile fashion.       Clinical Data: No additional findings.   Subjective: Chief Complaint  Patient presents with  . Left Shoulder - Pain  . Lower Back - Follow-up    HPI post epidural 12/04/2017 with good relief.  Some degree she got more than 50% pain.  Has had increased pain in her left shoulder patient is right-hand dominant she is noted  some decreased range of motion.  Overhead activity.  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 She appears well-developed.  HENT:  Head: Normocephalic.  Right Ear: External ear normal.  Left Ear: External ear normal.  Eyes: Pupils are equal, round, and reactive to light.  Neck: No tracheal deviation present. No thyromegaly present.  Cardiovascular: Normal rate.  Pulmonary/Chest: Effort normal.  Abdominal: Soft.  Neurological: She is alert and oriented to person, place, and time.  Skin: Skin is warm and dry.  Psychiatric: She has a normal mood and affect. Her behavior is normal.  Healed right total knee arthroplasty.  Left rotator cuff scars from rotator cuff repair.  Positive impingement left shoulder.  Negative drop arm test.  Long head of the biceps is normal no anterior subluxation.  Elbow reaches full extension.  Upper extremity reflexes are 2+ and symmetrical.  Ortho Exam  Specialty Comments:  No specialty comments available.  Imaging: No results found.   PMFS History: Patient Active Problem List  Diagnosis Date Noted  . Chronic right-sided low back pain with right-sided sciatica 12/02/2017  . Status post total replacement of right hip 02/27/2017  . 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 Essential hypertension, benign 11/02/2015  . Hyperlipidemia 11/02/2015  . Morbid obesity due to excess calories (HCC) 11/02/2015   Past Medical History:  Diagnosis Date  . Arthritis   . Diabetes mellitus, type II (HCC)   . Gout   . Headache   . Hypertension     Family History  Problem Relation Age of  Onset  . Cancer Mother        lung  . Hypertension Mother   . Hypertension Father     Past Surgical History:  Procedure Laterality Date  . NO PAST SURGERIES    . TOTAL HIP ARTHROPLASTY Right 02/17/2017   Procedure: TOTAL HIP ARTHROPLASTY ANTERIOR APPROACH;  Surgeon: Eldred Manges, MD;  Location: [facility] OR;  Service: Orthopedics;  Laterality: Right;  . TUBAL LIGATION     Social History   Occupational History  . Not on file  Tobacco Use  . Smoking status: Former Games developer  . Smokeless tobacco: Never Used  Substance and Sexual Activity  . Alcohol use: No  . Drug use: No  . Sexual activity: Not on file

## 2018-01-05 ENCOUNTER — Encounter (INDEPENDENT_AMBULATORY_CARE_PROVIDER_SITE_OTHER): Payer: Self-pay | Admitting: Orthopaedic Surgery

## 2018-01-05 MED ORDER — LIDOCAINE HCL 1 % IJ SOLN
0.5000 mL | INTRAMUSCULAR | Status: AC | PRN
  Administered 2018-01-01: .5 mL

## 2018-01-05 MED ORDER — BUPIVACAINE HCL 0.25 % IJ SOLN
4.0000 mL | INTRAMUSCULAR | Status: AC | PRN
  Administered 2018-01-01: 4 mL via INTRA_ARTICULAR

## 2018-01-05 MED ORDER — METHYLPREDNISOLONE ACETATE 40 MG/ML IJ SUSP
40.0000 mg | INTRAMUSCULAR | Status: AC | PRN
  Administered 2018-01-01: 40 mg via INTRA_ARTICULAR

## 2018-01-29 ENCOUNTER — Encounter (INDEPENDENT_AMBULATORY_CARE_PROVIDER_SITE_OTHER): Payer: Self-pay | Admitting: Orthopaedic Surgery

## 2018-01-29 ENCOUNTER — Ambulatory Visit (INDEPENDENT_AMBULATORY_CARE_PROVIDER_SITE_OTHER): Payer: Medicare Other | Admitting: Orthopaedic Surgery

## 2018-01-29 VITALS — BP 120/69 | HR 82 | Ht 67.0 in | Wt 186.0 lb

## 2018-01-29 DIAGNOSIS — G8929 Other chronic pain: Secondary | ICD-10-CM | POA: Diagnosis not present

## 2018-01-29 DIAGNOSIS — M5441 Lumbago with sciatica, right side: Secondary | ICD-10-CM | POA: Diagnosis not present

## 2018-01-29 DIAGNOSIS — M7542 Impingement syndrome of left shoulder: Secondary | ICD-10-CM

## 2018-01-29 NOTE — Progress Notes (Signed)
Office Visit Note   Patient: Anita Lewis           Date of Birth: 30-Oct-1946           MRN: [id number] Visit Date: 01/29/2018              Requested by: Louie Boston., MD [address] [place], [place] [address] PCP: Louie Boston., MD   Assessment & Plan: Visit Diagnoses:  1. Chronic right-sided low back pain with right-sided sciatica   2. Impingement syndrome of left shoulder     Plan: Come back in 3 months.  If she is doing better she can call and cancel her appointment.  We reviewed the MRI scan of her lumbar spine as well as the results of the x-ray of her shoulder which shows mild to moderate arthritis in her shoulder and disc degeneration with endplate reactive changes at L5-S1 with some disc protrusion.  Her total hip arthroplasty is doing well.  Follow-Up Instructions: Return in about 3 months (around 04/28/2018).   Orders:  No orders of the defined types were placed in this encounter.  No orders of the defined types were placed in this encounter.     Procedures: No procedures performed   Clinical Data: No additional findings.   Subjective: Chief Complaint  Patient presents with  . Left Shoulder - Pain, Follow-up  . Lower Back - Pain, Follow-up    HPI 72 year old female returns she had a shoulder injection 01/01/2018 for impingement in her shoulder with some mild to moderate osteoarthritis demonstrated on plain radiographs obtained that day.  She is had a prednisone pack for her back states her back is doing better.  Lumbar MRI scan late 2018 showed disc degeneration L5-S1 was some mild protrusion at L4-5.  Review of Systems review of systems positive for diabetes stage I kidney disease hypertension.  Status post right total hip arthroplasty.  Low back pain lumbar disc degeneration L5-S1.  Moderate osteoarthritis left shoulder with mild impingement improved.   Objective: Vital Signs: BP 120/69   Pulse 82   Ht 5\' 7"  (1.702 m)   Wt 186 lb (84.4 kg)    BMI 29.13 kg/m   Physical Exam  Constitutional: She is oriented to person, place, and time. She appears well-developed.  HENT:  Head: Normocephalic.  Right Ear: External ear normal.  Left Ear: External ear normal.  Eyes: Pupils are equal, round, and reactive to light.  Neck: No tracheal deviation present. No thyromegaly present.  Cardiovascular: Normal rate.  Pulmonary/Chest: Effort normal.  Abdominal: Soft.  Neurological: She is alert and oriented to person, place, and time.  Skin: Skin is warm and dry.  Psychiatric: She has a normal mood and affect. Her behavior is normal.    Ortho Exam drop arm test minimal impingement left shoulder.  Negative straight leg raising 90 degrees negative popliteal compression test negative logroll to the hips.  Anterior tib EHL is strong.  Distal pulses are palpable.  Specialty Comments:  No specialty comments available.  Imaging: No results found.   PMFS History: Patient Active Problem List   Diagnosis Date Noted  . Chronic right-sided low back pain with right-sided sciatica 12/02/2017  . Status post total replacement of right hip 02/27/2017  . 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 Essential hypertension, benign 11/02/2015  . Hyperlipidemia 11/02/2015  . Morbid obesity due to excess calories (HCC) 11/02/2015   Past Medical History:  Diagnosis Date  .  Arthritis   . Diabetes mellitus, type II (HCC)   . Gout   . Headache   . Hypertension     Family History  Problem Relation Age of Onset  . Cancer Mother        lung  . Hypertension Mother   . Hypertension Father     Past Surgical History:  Procedure Laterality Date  . NO PAST SURGERIES    . TOTAL HIP ARTHROPLASTY Right 02/17/2017   Procedure: TOTAL HIP ARTHROPLASTY ANTERIOR APPROACH;  Surgeon: Eldred Manges, MD;  Location: [facility] OR;  Service: Orthopedics;  Laterality: Right;  . TUBAL LIGATION     Social History   Occupational History  . Not  on file  Tobacco Use  . Smoking status: Former Games developer  . Smokeless tobacco: Never Used  Substance and Sexual Activity  . Alcohol use: No  . Drug use: No  . Sexual activity: Not on file

## 2018-04-23 ENCOUNTER — Ambulatory Visit (INDEPENDENT_AMBULATORY_CARE_PROVIDER_SITE_OTHER): Payer: 59 | Admitting: Orthopaedic Surgery

## 2018-07-22 NOTE — Patient Instructions (Signed)
Your procedure is scheduled on: 07/31/2018   Report to [facility] at  820  AM.  Call this number if you have problems the morning of surgery: [phone]   Do not eat food or drink liquids :After Midnight.      Take these medicines the morning of surgery with A SIP OF WATER: lisinopril, metoprolol, prilosec.   Do not wear jewelry, make-up or nail polish.  Do not wear lotions, powders, or perfumes. You may wear deodorant.  Do not shave 48 hours prior to surgery.  Do not bring valuables to the hospital.  Contacts, dentures or bridgework may not be worn into surgery.  Leave suitcase in the car. After surgery it may be brought to your room.  For patients admitted to the hospital, checkout time is 11:00 AM the day of discharge.   Patients discharged the day of surgery will not be allowed to drive home.  :     Please read over the following fact sheets that you were given: Coughing and Deep Breathing, Surgical Site Infection Prevention, Anesthesia Post-op Instructions and Care and Recovery After Surgery    Cataract A cataract is a clouding of the lens of the eye. When a lens becomes cloudy, vision is reduced based on the degree and nature of the clouding. Many cataracts reduce vision to some degree. Some cataracts make people more near-sighted as they develop. Other cataracts increase glare. Cataracts that are ignored and become worse can sometimes look white. The white color can be seen through the pupil. CAUSES   Aging. However, cataracts may occur at any age, even in newborns.   Certain drugs.   Trauma to the eye.   Certain diseases such as diabetes.   Specific eye diseases such as chronic inflammation inside the eye or a sudden attack of a rare form of glaucoma.   Inherited or acquired medical problems.  SYMPTOMS   Gradual, progressive drop in vision in the affected eye.   Severe, rapid visual loss. This most often happens when trauma is the cause.  DIAGNOSIS  To detect a  cataract, an eye doctor examines the lens. Cataracts are best diagnosed with an exam of the eyes with the pupils enlarged (dilated) by drops.  TREATMENT  For an early cataract, vision may improve by using different eyeglasses or stronger lighting. If that does not help your vision, surgery is the only effective treatment. A cataract needs to be surgically removed when vision loss interferes with your everyday activities, such as driving, reading, or watching TV. A cataract may also have to be removed if it prevents examination or treatment of another eye problem. Surgery removes the cloudy lens and usually replaces it with a substitute lens (intraocular lens, IOL).  At a time when both you and your doctor agree, the cataract will be surgically removed. If you have cataracts in both eyes, only one is usually removed at a time. This allows the operated eye to heal and be out of danger from any possible problems after surgery (such as infection or poor wound healing). In rare cases, a cataract may be doing damage to your eye. In these cases, your caregiver may advise surgical removal right away. The vast majority of people who have cataract surgery have better vision afterward. HOME CARE INSTRUCTIONS  If you are not planning surgery, you may be asked to do the following:  Use different eyeglasses.   Use stronger or brighter lighting.   Ask your eye doctor about reducing your  medicine dose or changing medicines if it is thought that a medicine caused your cataract. Changing medicines does not make the cataract go away on its own.   Become familiar with your surroundings. Poor vision can lead to injury. Avoid bumping into things on the affected side. You are at a higher risk for tripping or falling.   Exercise extreme care when driving or operating machinery.   Wear sunglasses if you are sensitive to bright light or experiencing problems with glare.  SEEK IMMEDIATE MEDICAL CARE IF:   You have a  worsening or sudden vision loss.   You notice redness, swelling, or increasing pain in the eye.   You have a fever.  Document Released: 12/16/2005 Document Revised: 12/05/2011 Document Reviewed: 08/09/2011 [facility] Patient Information 2012 [place].PATIENT INSTRUCTIONS POST-ANESTHESIA  IMMEDIATELY FOLLOWING SURGERY:  Do not drive or operate machinery for the first twenty four hours after surgery.  Do not make any important decisions for twenty four hours after surgery or while taking narcotic pain medications or sedatives.  If you develop intractable nausea and vomiting or a severe headache please notify your doctor immediately.  FOLLOW-UP:  Please make an appointment with your surgeon as instructed. You do not need to follow up with anesthesia unless specifically instructed to do so.  WOUND CARE INSTRUCTIONS (if applicable):  Keep a dry clean dressing on the anesthesia/puncture wound site if there is drainage.  Once the wound has quit draining you may leave it open to air.  Generally you should leave the bandage intact for twenty four hours unless there is drainage.  If the epidural site drains for more than 36-48 hours please call the anesthesia department.  QUESTIONS?:  Please feel free to call your physician or the hospital operator if you have any questions, and they will be happy to assist you.

## 2018-07-24 ENCOUNTER — Encounter (HOSPITAL_COMMUNITY): Payer: Self-pay

## 2018-07-24 ENCOUNTER — Encounter (HOSPITAL_COMMUNITY)
Admission: RE | Admit: 2018-07-24 | Discharge: 2018-07-24 | Disposition: A | Discharge status: Home / Self Care | Payer: Medicare Other | Source: Ambulatory Visit | Attending: Ophthalmology | Admitting: Ophthalmology

## 2018-07-24 ENCOUNTER — Other Ambulatory Visit: Payer: Self-pay

## 2018-07-24 DIAGNOSIS — Z01812 Encounter for preprocedural laboratory examination: Secondary | ICD-10-CM | POA: Diagnosis not present

## 2018-07-24 DIAGNOSIS — Z01810 Encounter for preprocedural cardiovascular examination: Secondary | ICD-10-CM | POA: Diagnosis present

## 2018-07-24 HISTORY — DX: Gastro-esophageal reflux disease without esophagitis: K21.9

## 2018-07-24 LAB — CBC
HEMATOCRIT: 33.2 % — AB (ref 36.0–46.0)
HEMOGLOBIN: 9.9 g/dL — AB (ref 12.0–15.0)
MCH: 24.8 pg — AB (ref 26.0–34.0)
MCHC: 29.8 g/dL — AB (ref 30.0–36.0)
MCV: 83.2 fL (ref 78.0–100.0)
Platelets: 266 10*3/uL (ref 150–400)
RBC: 3.99 MIL/uL (ref 3.87–5.11)
RDW: 16.3 % — ABNORMAL HIGH (ref 11.5–15.5)
WBC: 7.7 10*3/uL (ref 4.0–10.5)

## 2018-07-24 LAB — BASIC METABOLIC PANEL
Anion gap: 9 (ref 5–15)
BUN: 23 mg/dL (ref 8–23)
CHLORIDE: 108 mmol/L (ref 98–111)
CO2: 25 mmol/L (ref 22–32)
CREATININE: 1.21 mg/dL — AB (ref 0.44–1.00)
Calcium: 9.1 mg/dL (ref 8.9–10.3)
GFR calc non Af Amer: 44 mL/min — ABNORMAL LOW (ref >60)
GFR, EST AFRICAN AMERICAN: 51 mL/min — AB (ref >60)
Glucose, Bld: 116 mg/dL — ABNORMAL HIGH (ref 70–99)
POTASSIUM: 3.9 mmol/L (ref 3.5–5.1)
Sodium: 142 mmol/L (ref 135–145)

## 2018-07-24 LAB — GLUCOSE, CAPILLARY: Glucose-Capillary: 106 mg/dL — ABNORMAL HIGH (ref 70–99)

## 2018-07-24 LAB — HEMOGLOBIN A1C
Hgb A1c MFr Bld: 6.8 % — ABNORMAL HIGH (ref 4.8–5.6)
MEAN PLASMA GLUCOSE: 148.46 mg/dL

## 2018-07-31 ENCOUNTER — Other Ambulatory Visit: Payer: Self-pay

## 2018-07-31 ENCOUNTER — Encounter (HOSPITAL_COMMUNITY)
Admission: RE | Disposition: A | Discharge status: Home / Self Care | Payer: Self-pay | Source: Ambulatory Visit | Attending: Ophthalmology

## 2018-07-31 ENCOUNTER — Ambulatory Visit (HOSPITAL_COMMUNITY): Payer: Medicare Other | Admitting: Anesthesiology

## 2018-07-31 ENCOUNTER — Encounter (HOSPITAL_COMMUNITY): Payer: Self-pay | Admitting: *Deleted

## 2018-07-31 ENCOUNTER — Ambulatory Visit (HOSPITAL_COMMUNITY)
Admission: RE | Admit: 2018-07-31 | Discharge: 2018-07-31 | Disposition: A | Discharge status: Home / Self Care | Payer: Medicare Other | Source: Ambulatory Visit | Attending: Ophthalmology | Admitting: Ophthalmology

## 2018-07-31 DIAGNOSIS — Z7984 Long term (current) use of oral hypoglycemic drugs: Secondary | ICD-10-CM | POA: Diagnosis not present

## 2018-07-31 DIAGNOSIS — M069 Rheumatoid arthritis, unspecified: Secondary | ICD-10-CM | POA: Insufficient documentation

## 2018-07-31 DIAGNOSIS — E119 Type 2 diabetes mellitus without complications: Secondary | ICD-10-CM | POA: Insufficient documentation

## 2018-07-31 DIAGNOSIS — Z87891 Personal history of nicotine dependence: Secondary | ICD-10-CM | POA: Insufficient documentation

## 2018-07-31 DIAGNOSIS — H25811 Combined forms of age-related cataract, right eye: Secondary | ICD-10-CM | POA: Diagnosis present

## 2018-07-31 DIAGNOSIS — E7800 Pure hypercholesterolemia, unspecified: Secondary | ICD-10-CM | POA: Insufficient documentation

## 2018-07-31 DIAGNOSIS — Z7982 Long term (current) use of aspirin: Secondary | ICD-10-CM | POA: Diagnosis not present

## 2018-07-31 DIAGNOSIS — Z79899 Other long term (current) drug therapy: Secondary | ICD-10-CM | POA: Diagnosis not present

## 2018-07-31 DIAGNOSIS — I10 Essential (primary) hypertension: Secondary | ICD-10-CM | POA: Insufficient documentation

## 2018-07-31 HISTORY — PX: CATARACT EXTRACTION W/PHACO: SHX586

## 2018-07-31 LAB — GLUCOSE, CAPILLARY: Glucose-Capillary: 90 mg/dL (ref 70–99)

## 2018-07-31 SURGERY — PHACOEMULSIFICATION, CATARACT, WITH IOL INSERTION
Anesthesia: Monitor Anesthesia Care | Site: Eye | Laterality: Right

## 2018-07-31 MED ORDER — LIDOCAINE HCL (PF) 1 % IJ SOLN
INTRAOCULAR | Status: DC | PRN
  Administered 2018-07-31: .9 mL via OPHTHALMIC

## 2018-07-31 MED ORDER — FENTANYL CITRATE (PF) 100 MCG/2ML IJ SOLN
INTRAMUSCULAR | Status: DC | PRN
  Administered 2018-07-31 (×2): 50 ug via INTRAVENOUS

## 2018-07-31 MED ORDER — TETRACAINE HCL 0.5 % OP SOLN
1.0000 [drp] | OPHTHALMIC | Status: AC
  Administered 2018-07-31 (×3): 1 [drp] via OPHTHALMIC

## 2018-07-31 MED ORDER — SODIUM HYALURONATE 23 MG/ML IO SOLN
INTRAOCULAR | Status: DC | PRN
  Administered 2018-07-31: 0.6 mL via INTRAOCULAR

## 2018-07-31 MED ORDER — LACTATED RINGERS IV SOLN
INTRAVENOUS | Status: DC
  Administered 2018-07-31: 09:00:00 via INTRAVENOUS

## 2018-07-31 MED ORDER — POVIDONE-IODINE 5 % OP SOLN
OPHTHALMIC | Status: DC | PRN
  Administered 2018-07-31: 1 via OPHTHALMIC

## 2018-07-31 MED ORDER — NEOMYCIN-POLYMYXIN-DEXAMETH 3.5-10000-0.1 OP SUSP
OPHTHALMIC | Status: DC | PRN
  Administered 2018-07-31: 2 [drp] via OPHTHALMIC

## 2018-07-31 MED ORDER — PHENYLEPHRINE HCL 2.5 % OP SOLN
1.0000 [drp] | OPHTHALMIC | Status: AC
  Administered 2018-07-31 (×3): 1 [drp] via OPHTHALMIC

## 2018-07-31 MED ORDER — PROVISC 10 MG/ML IO SOLN
INTRAOCULAR | Status: DC | PRN
  Administered 2018-07-31: 0.85 mL via INTRAOCULAR

## 2018-07-31 MED ORDER — CYCLOPENTOLATE-PHENYLEPHRINE 0.2-1 % OP SOLN
1.0000 [drp] | OPHTHALMIC | Status: AC
  Administered 2018-07-31 (×3): 1 [drp] via OPHTHALMIC

## 2018-07-31 MED ORDER — FENTANYL CITRATE (PF) 100 MCG/2ML IJ SOLN
INTRAMUSCULAR | Status: AC
  Filled 2018-07-31: qty 2

## 2018-07-31 MED ORDER — MIDAZOLAM HCL 2 MG/2ML IJ SOLN
INTRAMUSCULAR | Status: AC
  Filled 2018-07-31: qty 2

## 2018-07-31 MED ORDER — LIDOCAINE HCL 3.5 % OP GEL
1.0000 "application " | Freq: Once | OPHTHALMIC | Status: AC
  Administered 2018-07-31: 1 via OPHTHALMIC

## 2018-07-31 MED ORDER — BSS IO SOLN
INTRAOCULAR | Status: DC | PRN
  Administered 2018-07-31: 500 mL

## 2018-07-31 MED ORDER — MIDAZOLAM HCL 2 MG/2ML IJ SOLN
INTRAMUSCULAR | Status: DC | PRN
  Administered 2018-07-31: 2 mg via INTRAVENOUS

## 2018-07-31 MED ORDER — BSS IO SOLN
INTRAOCULAR | Status: DC | PRN
  Administered 2018-07-31: 15 mL

## 2018-07-31 SURGICAL SUPPLY — 13 items

## 2018-07-31 NOTE — Anesthesia Preprocedure Evaluation (Signed)
Anesthesia Evaluation  Patient identified by MRN, date of birth, ID band Patient awake    Reviewed: Allergy & Precautions, H&P , NPO status , Patient's Chart, lab work & pertinent test results, reviewed documented beta blocker date and time   Airway Mallampati: II  TM Distance: >3 FB Neck ROM: full    Dental no notable dental hx. (+) Teeth Intact, Dental Advidsory Given   Pulmonary former smoker,    Pulmonary exam normal breath sounds clear to auscultation       Cardiovascular Exercise Tolerance: Good hypertension, On Medications  Rhythm:regular Rate:Normal     Neuro/Psych  Headaches,  Neuromuscular disease negative psych ROS   GI/Hepatic negative GI ROS, Neg liver ROS, GERD  ,  Endo/Other  negative endocrine ROSdiabetes, Type 2  Renal/GU Renal disease  negative genitourinary   Musculoskeletal   Abdominal   Peds  Hematology negative hematology ROS (+)   Anesthesia Other Findings NSR at 68 FBS 90  Reproductive/Obstetrics negative OB ROS                             Anesthesia Physical Anesthesia Plan  ASA: III  Anesthesia Plan: MAC   Post-op Pain Management:    Induction:   PONV Risk Score and Plan:   Airway Management Planned:   Additional Equipment:   Intra-op Plan:   Post-operative Plan:   Informed Consent: I have reviewed the patients History and Physical, chart, labs and discussed the procedure including the risks, benefits and alternatives for the proposed anesthesia with the patient or authorized representative who has indicated his/her understanding and acceptance.   Dental Advisory Given  Plan Discussed with: CRNA and Anesthesiologist  Anesthesia Plan Comments:         Anesthesia Quick Evaluation

## 2018-07-31 NOTE — Transfer of Care (Signed)
Immediate Anesthesia Transfer of Care Note  Patient: Anita Lewis  Procedure(s) Performed: CATARACT EXTRACTION PHACO AND INTRAOCULAR LENS PLACEMENT (IOC) (Right Eye)  Patient Location: PACU  Anesthesia Type:MAC  Level of Consciousness: awake, alert  and oriented  Airway & Oxygen Therapy: Patient Spontanous Breathing and Patient connected to nasal cannula oxygen  Post-op Assessment: Report given to RN and Post -op Vital signs reviewed and stable  Post vital signs: Reviewed and stable  Last Vitals:  Vitals Value Taken Time  BP    Temp    Pulse    Resp    SpO2      Last Pain:  Vitals:   07/31/18 0831  TempSrc: Oral  PainSc: 0-No pain      Patients Stated Pain Goal: 8 (28/24/17 5301)  Complications: No apparent anesthesia complications

## 2018-07-31 NOTE — Anesthesia Postprocedure Evaluation (Signed)
Anesthesia Post Note  Patient: Anita Lewis  Procedure(s) Performed: CATARACT EXTRACTION PHACO AND INTRAOCULAR LENS PLACEMENT (IOC) (Right Eye)  Patient location during evaluation: PACU Anesthesia Type: MAC Level of consciousness: awake and alert and oriented Pain management: pain level controlled Vital Signs Assessment: post-procedure vital signs reviewed and stable Respiratory status: spontaneous breathing, nonlabored ventilation, respiratory function stable and patient connected to nasal cannula oxygen Cardiovascular status: stable Postop Assessment: no apparent nausea or vomiting Anesthetic complications: no     Last Vitals:  Vitals:   07/31/18 0945 07/31/18 1024  BP: (!) 137/97 135/72  Pulse:  62  Resp:  18  Temp:  36.5 C  SpO2: 98% 99%    Last Pain:  Vitals:   07/31/18 1024  TempSrc: Oral  PainSc: 0-No pain                 Kamsiyochukwu Buist,Jaynie

## 2018-07-31 NOTE — H&P (Signed)
The H and P was reviewed and updated. The patient was examined.  No changes were found after exam.  The surgical eye was marked.  

## 2018-07-31 NOTE — Discharge Instructions (Signed)
Please discharge patient when stable, will follow up today with Dr. Ronica Vivian at the [facility] office immediately following discharge.  Leave shield in place until visit.  All paperwork with discharge instructions will be given at the office. ° °

## 2018-07-31 NOTE — Op Note (Signed)
Date of procedure: 07/31/18  Pre-operative diagnosis: Visually significant cataract, Right Eye (H25.811)  Post-operative diagnosis: Visually significant cataract, Right Eye  Procedure: Removal of cataract via phacoemulsification and insertion of intra-ocular lens Wynetta Emery and Eldridge  +19.5D into the capsular bag of the Right Eye  Attending surgeon: Gerda Diss. Armine Rizzolo, MD, MA  Anesthesia: MAC, Topical Akten  Complications: None  Estimated Blood Loss: <72m (minimal)  Specimens: None  Implants: As above  Indications:  Visually significant cataract, Right Eye  Procedure:  The patient was seen and identified in the pre-operative area. The operative eye was identified and dilated.  The operative eye was marked.  Topical anesthesia was administered to the operative eye.     The patient was then to the operative suite and placed in the supine position.  A timeout was performed confirming the patient, procedure to be performed, and all other relevant information.   The patient's face was prepped and draped in the usual fashion for intra-ocular surgery.  A lid speculum was placed into the operative eye and the surgical microscope moved into place and focused.  A superotemporal paracentesis was created using a 20 gauge paracentesis blade.  Shugarcaine was injected into the anterior chamber.  Viscoelastic was injected into the anterior chamber.  A temporal clear-corneal main wound incision was created using a 2.474mmicrokeratome.  A continuous curvilinear capsulorrhexis was initiated using an irrigating cystitome and completed using capsulorrhexis forceps.  Hydrodissection and hydrodeliniation were performed.  Viscoelastic was injected into the anterior chamber.  A phacoemulsification handpiece and a chopper as a second instrument were used to remove the nucleus and epinucleus. The irrigation/aspiration handpiece was used to remove any remaining cortical material.   The capsular bag was  reinflated with viscoelastic, checked, and found to be intact.  The intraocular lens was inserted into the capsular bag and dialed into place using a Kuglen hook.  The irrigation/aspiration handpiece was used to remove any remaining viscoelastic.  The clear corneal wound and paracentesis wounds were then hydrated and checked with Weck-Cels to be watertight.  The lid-speculum and drape was removed, and the patient's face was cleaned with a wet and dry 4x4.  Maxitrol was instilled in the eye before a clear shield was taped over the eye. The patient was taken to the post-operative care unit in good condition, having tolerated the procedure well.  Post-Op Instructions: The patient will [facility] a same day post-operative evaluation and will receive all other orders and instructions.

## 2018-08-03 ENCOUNTER — Encounter (HOSPITAL_COMMUNITY): Payer: Self-pay | Admitting: Ophthalmology

## 2018-09-15 ENCOUNTER — Encounter (HOSPITAL_COMMUNITY): Payer: Self-pay

## 2018-09-15 ENCOUNTER — Encounter (HOSPITAL_COMMUNITY)
Admission: RE | Admit: 2018-09-15 | Discharge: 2018-09-15 | Disposition: A | Discharge status: Home / Self Care | Payer: Medicare Other | Source: Ambulatory Visit | Attending: Ophthalmology | Admitting: Ophthalmology

## 2018-09-18 ENCOUNTER — Encounter (HOSPITAL_COMMUNITY)
Admission: RE | Disposition: A | Discharge status: Home / Self Care | Payer: Self-pay | Source: Ambulatory Visit | Attending: Ophthalmology

## 2018-09-18 ENCOUNTER — Ambulatory Visit (HOSPITAL_COMMUNITY)
Admission: RE | Admit: 2018-09-18 | Discharge: 2018-09-18 | Disposition: A | Discharge status: Home / Self Care | Payer: Medicare Other | Source: Ambulatory Visit | Attending: Ophthalmology | Admitting: Ophthalmology

## 2018-09-18 ENCOUNTER — Encounter (HOSPITAL_COMMUNITY): Payer: Self-pay | Admitting: Anesthesiology

## 2018-09-18 ENCOUNTER — Ambulatory Visit (HOSPITAL_COMMUNITY): Payer: Medicare Other | Admitting: Anesthesiology

## 2018-09-18 DIAGNOSIS — Z7984 Long term (current) use of oral hypoglycemic drugs: Secondary | ICD-10-CM | POA: Diagnosis not present

## 2018-09-18 DIAGNOSIS — G709 Myoneural disorder, unspecified: Secondary | ICD-10-CM | POA: Diagnosis not present

## 2018-09-18 DIAGNOSIS — K219 Gastro-esophageal reflux disease without esophagitis: Secondary | ICD-10-CM | POA: Diagnosis not present

## 2018-09-18 DIAGNOSIS — Z7982 Long term (current) use of aspirin: Secondary | ICD-10-CM | POA: Diagnosis not present

## 2018-09-18 DIAGNOSIS — R51 Headache: Secondary | ICD-10-CM | POA: Insufficient documentation

## 2018-09-18 DIAGNOSIS — Z87891 Personal history of nicotine dependence: Secondary | ICD-10-CM | POA: Insufficient documentation

## 2018-09-18 DIAGNOSIS — M069 Rheumatoid arthritis, unspecified: Secondary | ICD-10-CM | POA: Insufficient documentation

## 2018-09-18 DIAGNOSIS — I10 Essential (primary) hypertension: Secondary | ICD-10-CM | POA: Diagnosis not present

## 2018-09-18 DIAGNOSIS — Z79899 Other long term (current) drug therapy: Secondary | ICD-10-CM | POA: Diagnosis not present

## 2018-09-18 DIAGNOSIS — E1136 Type 2 diabetes mellitus with diabetic cataract: Secondary | ICD-10-CM | POA: Insufficient documentation

## 2018-09-18 DIAGNOSIS — H269 Unspecified cataract: Secondary | ICD-10-CM | POA: Insufficient documentation

## 2018-09-18 HISTORY — PX: CATARACT EXTRACTION W/PHACO: SHX586

## 2018-09-18 LAB — GLUCOSE, CAPILLARY: Glucose-Capillary: 127 mg/dL — ABNORMAL HIGH (ref 70–99)

## 2018-09-18 SURGERY — PHACOEMULSIFICATION, CATARACT, WITH IOL INSERTION
Anesthesia: Monitor Anesthesia Care | Site: Eye | Laterality: Left

## 2018-09-18 MED ORDER — SODIUM HYALURONATE 23 MG/ML IO SOLN
INTRAOCULAR | Status: DC | PRN
  Administered 2018-09-18: 0.6 mL via INTRAOCULAR

## 2018-09-18 MED ORDER — NEOMYCIN-POLYMYXIN-DEXAMETH 3.5-10000-0.1 OP SUSP
OPHTHALMIC | Status: DC | PRN
  Administered 2018-09-18: 2 [drp] via OPHTHALMIC

## 2018-09-18 MED ORDER — EPINEPHRINE PF 1 MG/ML IJ SOLN
INTRAOCULAR | Status: DC | PRN
  Administered 2018-09-18: 500 mL

## 2018-09-18 MED ORDER — LIDOCAINE HCL (PF) 1 % IJ SOLN
INTRAOCULAR | Status: DC | PRN
  Administered 2018-09-18: .9 mL via OPHTHALMIC

## 2018-09-18 MED ORDER — PROVISC 10 MG/ML IO SOLN
INTRAOCULAR | Status: DC | PRN
  Administered 2018-09-18: 0.85 mL via INTRAOCULAR

## 2018-09-18 MED ORDER — LIDOCAINE HCL 3.5 % OP GEL: 1.0000 "application " | Freq: Once | OPHTHALMIC | Status: DC

## 2018-09-18 MED ORDER — CYCLOPENTOLATE-PHENYLEPHRINE 0.2-1 % OP SOLN
1.0000 [drp] | OPHTHALMIC | Status: AC
  Administered 2018-09-18 (×3): 1 [drp] via OPHTHALMIC

## 2018-09-18 MED ORDER — LACTATED RINGERS IV SOLN
INTRAVENOUS | Status: DC
  Administered 2018-09-18: 09:00:00 via INTRAVENOUS

## 2018-09-18 MED ORDER — PHENYLEPHRINE HCL 2.5 % OP SOLN
1.0000 [drp] | OPHTHALMIC | Status: AC
  Administered 2018-09-18 (×3): 1 [drp] via OPHTHALMIC

## 2018-09-18 MED ORDER — MIDAZOLAM HCL 2 MG/2ML IJ SOLN
INTRAMUSCULAR | Status: AC
  Filled 2018-09-18: qty 2

## 2018-09-18 MED ORDER — MIDAZOLAM HCL 5 MG/5ML IJ SOLN
INTRAMUSCULAR | Status: DC | PRN
  Administered 2018-09-18: 2 mg via INTRAVENOUS

## 2018-09-18 MED ORDER — BSS IO SOLN
INTRAOCULAR | Status: DC | PRN
  Administered 2018-09-18: 15 mL via INTRAOCULAR

## 2018-09-18 MED ORDER — TETRACAINE HCL 0.5 % OP SOLN
1.0000 [drp] | OPHTHALMIC | Status: AC
  Administered 2018-09-18 (×3): 1 [drp] via OPHTHALMIC

## 2018-09-18 MED ORDER — POVIDONE-IODINE 5 % OP SOLN
OPHTHALMIC | Status: DC | PRN
  Administered 2018-09-18: 1 via OPHTHALMIC

## 2018-09-18 SURGICAL SUPPLY — 16 items
CLOTH BEACON ORANGE TIMEOUT ST (SAFETY) ×2 IMPLANT
EYE SHIELD UNIVERSAL CLEAR (GAUZE/BANDAGES/DRESSINGS) ×2 IMPLANT
GLOVE BIOGEL PI IND STRL 7.0 (GLOVE) IMPLANT
GLOVE BIOGEL PI IND STRL 7.5 (GLOVE) IMPLANT
GLOVE BIOGEL PI INDICATOR 7.0 (GLOVE) ×4
GLOVE BIOGEL PI INDICATOR 7.5 (GLOVE) ×2
GOWN STRL REUS W/TWL LRG LVL3 (GOWN DISPOSABLE) ×2 IMPLANT
LENS ALC ACRYL/TECN (Ophthalmic Related) ×2 IMPLANT
NDL HYPO 18GX1.5 BLUNT FILL (NEEDLE) IMPLANT
NEEDLE HYPO 18GX1.5 BLUNT FILL (NEEDLE) ×3 IMPLANT
PAD ARMBOARD 7.5X6 YLW CONV (MISCELLANEOUS) ×2 IMPLANT
SYR TB 1ML LL NO SAFETY (SYRINGE) ×2 IMPLANT
TAPE SURG TRANSPORE 1 IN (GAUZE/BANDAGES/DRESSINGS) IMPLANT
TAPE SURGICAL TRANSPORE 1 IN (GAUZE/BANDAGES/DRESSINGS) ×2
VISCOELASTIC ADDITIONAL (OPHTHALMIC RELATED) ×2 IMPLANT
WATER STERILE IRR 250ML POUR (IV SOLUTION) ×2 IMPLANT

## 2018-09-18 NOTE — Anesthesia Preprocedure Evaluation (Signed)
Anesthesia Evaluation  Patient identified by MRN, date of birth, ID band Patient awake    Reviewed: Allergy & Precautions, H&P , NPO status , Patient's Chart, lab work & pertinent test results, reviewed documented beta blocker date and time   Airway Mallampati: II  TM Distance: >3 FB Neck ROM: full    Dental no notable dental hx. (+) Teeth Intact, Dental Advidsory Given   Pulmonary neg pulmonary ROS, former smoker,    Pulmonary exam normal breath sounds clear to auscultation       Cardiovascular Exercise Tolerance: Good hypertension, On Medications negative cardio ROS   Rhythm:regular Rate:Normal     Neuro/Psych  Headaches,  Neuromuscular disease negative neurological ROS  negative psych ROS   GI/Hepatic negative GI ROS, Neg liver ROS, GERD  ,  Endo/Other  negative endocrine ROSdiabetes, Type 2  Renal/GU Renal diseasenegative Renal ROS  negative genitourinary   Musculoskeletal   Abdominal   Peds  Hematology negative hematology ROS (+)   Anesthesia Other Findings NSR at 68 FBS 90  Reproductive/Obstetrics negative OB ROS                             Anesthesia Physical  Anesthesia Plan  ASA: II  Anesthesia Plan: MAC   Post-op Pain Management:    Induction:   PONV Risk Score and Plan:   Airway Management Planned:   Additional Equipment:   Intra-op Plan:   Post-operative Plan:   Informed Consent: I have reviewed the patients History and Physical, chart, labs and discussed the procedure including the risks, benefits and alternatives for the proposed anesthesia with the patient or authorized representative who has indicated his/her understanding and acceptance.   Dental Advisory Given  Plan Discussed with: CRNA  Anesthesia Plan Comments:         Anesthesia Quick Evaluation

## 2018-09-18 NOTE — Anesthesia Postprocedure Evaluation (Signed)
Anesthesia Post Note  Patient: Anita Lewis  Procedure(s) Performed: CATARACT EXTRACTION PHACO AND INTRAOCULAR LENS PLACEMENT ([place]) (Left Eye)  Patient location during evaluation: Short Stay Anesthesia Type: MAC Level of consciousness: awake and alert and oriented Pain management: pain level controlled Vital Signs Assessment: post-procedure vital signs reviewed and stable Respiratory status: spontaneous breathing Cardiovascular status: blood pressure returned to baseline and stable Postop Assessment: no apparent nausea or vomiting Anesthetic complications: no     Last Vitals:  Vitals:   09/18/18 0843 09/18/18 0844  BP: 140/73   Pulse: 70   Resp: 16   Temp: 36.9 C   SpO2: 100% 100%    Last Pain:  Vitals:   09/18/18 0843  TempSrc: Oral  PainSc: 0-No pain                 Cyera Balboni

## 2018-09-18 NOTE — Transfer of Care (Signed)
Immediate Anesthesia Transfer of Care Note  Patient: Anita Lewis  Procedure(s) Performed: CATARACT EXTRACTION PHACO AND INTRAOCULAR LENS PLACEMENT ([facility]) (Left Eye)  Patient Location: Short Stay  Anesthesia Type:MAC  Level of Consciousness: awake  Airway & Oxygen Therapy: Patient Spontanous Breathing  Post-op Assessment: Report given to RN  Post vital signs: Reviewed  Last Vitals:  Vitals Value Taken Time  BP    Temp    Pulse    Resp    SpO2      Last Pain:  Vitals:   09/18/18 0843  TempSrc: Oral  PainSc: 0-No pain      Patients Stated Pain Goal: 5 ([id number])  Complications: No apparent anesthesia complications

## 2018-09-18 NOTE — Discharge Instructions (Addendum)
Please discharge patient when stable, will follow up today with Dr. Wrzosek at the [facility] office immediately following discharge.  Leave shield in place until visit.  All paperwork with discharge instructions will be given at the office. ° ° °Monitored Anesthesia Care, Care After °These instructions provide you with information about caring for yourself after your procedure. Your health care provider may also give you more specific instructions. Your treatment has been planned according to current medical practices, but problems sometimes occur. Call your health care provider if you have any problems or questions after your procedure. °What can I expect after the procedure? °After your procedure, it is common to: °· Feel sleepy for several hours. °· Feel clumsy and have poor balance for several hours. °· Feel forgetful about what happened after the procedure. °· Have poor judgment for several hours. °· Feel nauseous or vomit. °· Have a sore throat if you had a breathing tube during the procedure. ° °Follow these instructions at home: °For at least 24 hours after the procedure: ° °· Do not: °? Participate in activities in which you could fall or become injured. °? Drive. °? Use heavy machinery. °? Drink alcohol. °? Take sleeping pills or medicines that cause drowsiness. °? Make important decisions or sign legal documents. °? Take care of children on your own. °· Rest. °Eating and drinking °· Follow the diet that is recommended by your health care provider. °· If you vomit, drink water, juice, or soup when you can drink without vomiting. °· Make sure you have little or no nausea before eating solid foods. °General instructions °· Have a responsible adult stay with you until you are awake and alert. °· Take over-the-counter and prescription medicines only as told by your health care provider. °· If you smoke, do not smoke without supervision. °· Keep all follow-up visits as told by your health care  provider. This is important. °Contact a health care provider if: °· You keep feeling nauseous or you keep vomiting. °· You feel light-headed. °· You develop a rash. °· You have a fever. °Get help right away if: °· You have trouble breathing. °This information is not intended to replace advice given to you by your health care provider. Make sure you discuss any questions you have with your health care provider. °Document Released: 04/07/2016 Document Revised: 08/07/2016 Document Reviewed: 04/07/2016 °Elsevier Interactive Patient Education © 2018 [employer] ° °

## 2018-09-18 NOTE — H&P (Signed)
The H and P was reviewed and updated. The patient was examined.  No changes were found after exam.  The surgical eye was marked.  

## 2018-09-18 NOTE — Op Note (Signed)
Date of procedure: 09/18/18  Pre-operative diagnosis: Visually significant cataract, Left Eye (H25.812)  Post-operative diagnosis: Visually significant cataract, Left Eye  Procedure: Removal of cataract via phacoemulsification and insertion of intra-ocular lens Wynetta Emery and Lucas  +19.5D into the capsular bag of the Left Eye  Attending surgeon: Gerda Diss. Kamari Bilek, MD, MA  Anesthesia: MAC, Topical Akten  Complications: None  Estimated Blood Loss: <51m (minimal)  Specimens: None  Implants: As above  Indications:  Visually significant cataract, Left Eye  Procedure:  The patient was seen and identified in the pre-operative area. The operative eye was identified and dilated.  The operative eye was marked.  Topical anesthesia was administered to the operative eye.     The patient was then to the operative suite and placed in the supine position.  A timeout was performed confirming the patient, procedure to be performed, and all other relevant information.   The patient's face was prepped and draped in the usual fashion for intra-ocular surgery.  A lid speculum was placed into the operative eye and the surgical microscope moved into place and focused.  An inferotemporal paracentesis was created using a 20 gauge paracentesis blade.  Shugarcaine was injected into the anterior chamber.  Viscoelastic was injected into the anterior chamber.  A temporal clear-corneal main wound incision was created using a 2.461mmicrokeratome.  A continuous curvilinear capsulorrhexis was initiated using an irrigating cystitome and completed using capsulorrhexis forceps.  Hydrodissection and hydrodeliniation were performed.  Viscoelastic was injected into the anterior chamber.  A phacoemulsification handpiece and a chopper as a second instrument were used to remove the nucleus and epinucleus. The irrigation/aspiration handpiece was used to remove any remaining cortical material.   The capsular bag was  reinflated with viscoelastic, checked, and found to be intact.  The intraocular lens was inserted into the capsular bag and dialed into place using a Kuglen hook.  The irrigation/aspiration handpiece was used to remove any remaining viscoelastic.  The clear corneal wound and paracentesis wounds were then hydrated and checked with Weck-Cels to be watertight.  The lid-speculum and drape was removed, and the patient's face was cleaned with a wet and dry 4x4.  Maxitrol was instilled in the eye before a clear shield was taped over the eye. The patient was taken to the post-operative care unit in good condition, having tolerated the procedure well.  Post-Op Instructions: [facility] and will receive all other orders and instructions.

## 2018-09-21 ENCOUNTER — Encounter (HOSPITAL_COMMUNITY): Payer: Self-pay | Admitting: Ophthalmology

## 2019-02-18 ENCOUNTER — Encounter (INDEPENDENT_AMBULATORY_CARE_PROVIDER_SITE_OTHER): Payer: Self-pay | Admitting: Orthopaedic Surgery

## 2019-02-18 ENCOUNTER — Ambulatory Visit (INDEPENDENT_AMBULATORY_CARE_PROVIDER_SITE_OTHER): Payer: Medicare Other | Admitting: Orthopaedic Surgery

## 2019-02-18 ENCOUNTER — Ambulatory Visit (INDEPENDENT_AMBULATORY_CARE_PROVIDER_SITE_OTHER): Payer: Self-pay

## 2019-02-18 VITALS — BP 128/66 | HR 75 | Ht 66.0 in | Wt 163.0 lb

## 2019-02-18 DIAGNOSIS — M5127 Other intervertebral disc displacement, lumbosacral region: Secondary | ICD-10-CM | POA: Diagnosis not present

## 2019-02-18 DIAGNOSIS — M545 Low back pain, unspecified: Secondary | ICD-10-CM

## 2019-02-18 NOTE — Progress Notes (Signed)
Office Visit Note   Patient: Anita Lewis           Date of Birth: [birth year]           MRN: [id number] Visit Date: 02/18/2019              Requested by: Medicine, [facility] [address] [place], [place] [address] PCP: Medicine, [facility]   Assessment & Plan: Visit Diagnoses:  1. Acute bilateral low back pain, unspecified whether sciatica present     Plan: L5 -S1 protrusion with recurrent symptoms she is requesting repeat epidural which we will schedule with Dr. Alvester Morin of this not effective she will let us know.  Previous injection gave her more than a year's relief.  Follow-Up Instructions: No follow-ups on file.   Orders:  Orders Placed This Encounter  Procedures  . XR Lumbar Spine 2-3 Views   No orders of the defined types were placed in this encounter.     Procedures: No procedures performed   Clinical Data: No additional findings.   Subjective: Chief Complaint  Patient presents with  . Lower Back - Pain    HPI 73 year old female returns with recurrent back pain lumbosacral junction she rates as 10 out of 10 she is having to stand up in the office has pain when she sits turns or twists only gets relief with supine position.  Standing is next best.  No associated bowel or bladder symptoms.  Previous epidural December 2018 with Dr. Alvester Morin gave her excellent relief until a week ago when her pain became severe again.  No fever chills.  Review of Systems positive for diabetes if stopped her insulin due to improvement in her disease.  Positive for hypertension previous right total hip arthroplasty doing well.  One disc protrusion central and right.  MRI was 2018.   Objective: Vital Signs: BP 128/66   Pulse 75   Ht 5\' 6"  (1.676 m)   Wt 163 lb (73.9 kg)   BMI 26.31 kg/m   Physical Exam Constitutional:      Appearance: She is well-developed.  HENT:     Head: Normocephalic.     Right Ear: External ear normal.     Left Ear: External ear normal.  Eyes:    Pupils: Pupils are equal, round, and reactive to light.  Neck:     Thyroid: No thyromegaly.     Trachea: No tracheal deviation.  Cardiovascular:     Rate and Rhythm: Normal rate.  Pulmonary:     Effort: Pulmonary effort is normal.  Abdominal:     Palpations: Abdomen is soft.  Skin:    General: Skin is warm and dry.  Neurological:     Mental Status: She is alert and oriented to person, place, and time.  Psychiatric:        Behavior: Behavior normal.     Ortho Exam patient has pain with palpation lumbosacral junction negative logroll to the hips.  Some pain with straight leg raising.  She has difficulty getting from sitting to standing.  Anterior tib gastrocsoleus is intact. Specialty Comments:  No specialty comments available.  Imaging: Xr Lumbar Spine 2-3 Views  Result Date: 02/18/2019 [facility] lateral spot L5-S1 lateral x-ray obtained and reviewed.  This shows mild calcification abdominal aorta unchanged from 12/19/2016.  There is narrowing L5-S1 with endplate sclerosis unchanged from previous imaging without significant progression.  Facet arthropathy is noted.  Negative for acute changes. Impression: Chronic L5-S1 disc degeneration.  Without definite progression  in comparison to 12/19/2016 images    PMFS History: Patient Active Problem List   Diagnosis Date Noted  . Chronic right-sided low back pain with right-sided sciatica 12/02/2017  . Status post total replacement of right hip 02/27/2017  . 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 Essential hypertension, benign 11/02/2015  . Hyperlipidemia 11/02/2015  . Morbid obesity due to excess calories (HCC) 11/02/2015   Past Medical History:  Diagnosis Date  . Arthritis   . Diabetes mellitus, type II (HCC)   . GERD (gastroesophageal reflux disease)   . Gout   . Headache   . Hypertension     Family History  Problem Relation Age of Onset  . Cancer Mother        lung  . Hypertension Mother     . Hypertension Father     Past Surgical History:  Procedure Laterality Date  . CATARACT EXTRACTION W/PHACO Right 07/31/2018   Procedure: CATARACT EXTRACTION PHACO AND INTRAOCULAR LENS PLACEMENT ([facility]);  Surgeon: Fabio Pierce, MD;  Location: [facility];  Service: Ophthalmology;  Laterality: Right;  CDE: 8.39  . CATARACT EXTRACTION W/PHACO Left 09/18/2018   Procedure: CATARACT EXTRACTION PHACO AND INTRAOCULAR LENS PLACEMENT ([facility]);  Surgeon: Fabio Pierce, MD;  Location: [facility];  Service: Ophthalmology;  Laterality: Left;  CDE: 7.60  . NO PAST SURGERIES    . TOTAL HIP ARTHROPLASTY Right 02/17/2017   Procedure: TOTAL HIP ARTHROPLASTY ANTERIOR APPROACH;  Surgeon: Eldred Manges, MD;  Location: [facility];  Service: Orthopedics;  Laterality: Right;  . TUBAL LIGATION     Social History   Occupational History  . Not on file  Tobacco Use  . Smoking status: Former Smoker    Packs/day: 0.50    Years: 5.00    Pack years: 2.50    Types: Cigarettes    Last attempt to quit: 07/24/1998    Years since quitting: 20.5  . Smokeless tobacco: Never Used  Substance and Sexual Activity  . Alcohol use: No  . Drug use: No  . Sexual activity: Yes    Birth control/protection: Post-menopausal

## 2019-02-18 NOTE — Addendum Note (Signed)
Addended by: Rogers Seeds on: 02/18/2019 11:01 AM   Modules accepted: Orders

## 2019-03-08 ENCOUNTER — Ambulatory Visit (INDEPENDENT_AMBULATORY_CARE_PROVIDER_SITE_OTHER): Payer: Self-pay

## 2019-03-08 ENCOUNTER — Ambulatory Visit (INDEPENDENT_AMBULATORY_CARE_PROVIDER_SITE_OTHER): Payer: Medicare Other | Admitting: Physical Medicine and Rehabilitation

## 2019-03-08 ENCOUNTER — Encounter (INDEPENDENT_AMBULATORY_CARE_PROVIDER_SITE_OTHER): Payer: Self-pay | Admitting: Physical Medicine and Rehabilitation

## 2019-03-08 VITALS — BP 127/71 | HR 61 | Temp 98.0°F

## 2019-03-08 DIAGNOSIS — M5416 Radiculopathy, lumbar region: Secondary | ICD-10-CM | POA: Diagnosis not present

## 2019-03-08 DIAGNOSIS — M5116 Intervertebral disc disorders with radiculopathy, lumbar region: Secondary | ICD-10-CM

## 2019-03-08 MED ORDER — METHYLPREDNISOLONE ACETATE 80 MG/ML IJ SUSP
80.0000 mg | Freq: Once | INTRAMUSCULAR | Status: AC
  Administered 2019-03-08: 80 mg

## 2019-03-08 NOTE — Procedures (Signed)
Lumbar Epidural Steroid Injection - Interlaminar Approach with Fluoroscopic Guidance  Patient: Anita Lewis      Date of Birth: 01/30/1946 MRN: [id number] PCP: [facility]      Visit Date: 03/08/2019   Universal Protocol:     Consent Given By: the patient  Position: PRONE  Additional Comments: Vital signs were monitored before and after the procedure. Patient was prepped and draped in the usual sterile fashion. The correct patient, procedure, and site was verified.   Injection Procedure Details:  Procedure Site One Meds Administered:  Meds ordered this encounter  Medications  . methylPREDNISolone acetate (DEPO-MEDROL) injection 80 mg     Laterality: Right  Location/Site:  L5-S1  Needle size: 20 G  Needle type: Tuohy  Needle Placement: Paramedian epidural  Findings:   -Comments: Excellent flow of contrast into the epidural space.  Procedure Details: Using a paramedian approach from the side mentioned above, the region overlying the inferior lamina was localized under fluoroscopic visualization and the soft tissues overlying this structure were infiltrated with 4 ml. of 1% Lidocaine without Epinephrine. The Tuohy needle was inserted into the epidural space using a paramedian approach.   The epidural space was localized using loss of resistance along with lateral and bi-planar fluoroscopic views.  After negative aspirate for air, blood, and CSF, a 2 ml. volume of Isovue-250 was injected into the epidural space and the flow of contrast was observed. Radiographs were obtained for documentation purposes.    The injectate was administered into the level noted above.   Additional Comments:  The patient tolerated the procedure well Dressing: 2 x 2 sterile gauze and Band-Aid    Post-procedure details: Patient was observed during the procedure. Post-procedure instructions were reviewed.  Patient left the clinic in stable condition.

## 2019-03-08 NOTE — Progress Notes (Signed)
  Numeric Pain Rating Scale and Functional Assessment Average Pain (5)   In the last MONTH (on 0-10 scale) has pain interfered with the following?  1. General activity like being  able to carry out your everyday physical activities such as walking, climbing stairs, carrying groceries, or moving a chair?  Rating(5)   +Driver, -BT, -Dye Allergies.  

## 2019-03-08 NOTE — Progress Notes (Signed)
Anita Lewis - 73 y.o. female MRN [id number]  Date of birth: [birth year]  Office Visit Note: Visit Date: 03/08/2019 PCP: [facility] Referred by: [facility]  Subjective: Chief Complaint  Patient presents with  . Lower Back - Pain   HPI:  Anita Lewis is a 73 y.o. female who comes in today At the request of Dr. Polly Cobia for repeat right L5-S1 interlaminar epidural steroid injection.  Patient does have disc protrusion at L5-S1 with right radicular leg complaints.  Prior injection in December gave her quite a bit of relief.  ROS Otherwise per HPI.  Assessment & Plan: Visit Diagnoses:  1. Lumbar radiculopathy   2. Radiculopathy due to lumbar intervertebral disc disorder     Plan: No additional findings.   Meds & Orders:  Meds ordered this encounter  Medications  . methylPREDNISolone acetate (DEPO-MEDROL) injection 80 mg    Orders Placed This Encounter  Procedures  . XR C-ARM NO REPORT  . Epidural Steroid injection    Follow-up: Return if symptoms worsen or fail to improve, for Annell Greening, MD.   Procedures: No procedures performed  Lumbar Epidural Steroid Injection - Interlaminar Approach with Fluoroscopic Guidance  Patient: Anita Lewis      Date of Birth: 06-20-1946 MRN: [id number] PCP: [facility]      Visit Date: 03/08/2019   Universal Protocol:     Consent Given By: the patient  Position: PRONE  Additional Comments: Vital signs were monitored before and after the procedure. Patient was prepped and draped in the usual sterile fashion. The correct patient, procedure, and site was verified.   Injection Procedure Details:  Procedure Site One Meds Administered:  Meds ordered this encounter  Medications  . methylPREDNISolone acetate (DEPO-MEDROL) injection 80 mg     Laterality: Right  Location/Site:  L5-S1  Needle size: 20 G  Needle type: Tuohy  Needle Placement: Paramedian epidural  Findings:    -Comments: Excellent flow of contrast into the epidural space.  Procedure Details: Using a paramedian approach from the side mentioned above, the region overlying the inferior lamina was localized under fluoroscopic visualization and the soft tissues overlying this structure were infiltrated with 4 ml. of 1% Lidocaine without Epinephrine. The Tuohy needle was inserted into the epidural space using a paramedian approach.   The epidural space was localized using loss of resistance along with lateral and bi-planar fluoroscopic views.  After negative aspirate for air, blood, and CSF, a 2 ml. volume of Isovue-250 was injected into the epidural space and the flow of contrast was observed. Radiographs were obtained for documentation purposes.    The injectate was administered into the level noted above.   Additional Comments:  The patient tolerated the procedure well Dressing: 2 x 2 sterile gauze and Band-Aid    Post-procedure details: Patient was observed during the procedure. Post-procedure instructions were reviewed.  Patient left the clinic in stable condition.   Clinical History: MRI LUMBAR SPINE WITHOUT CONTRAST  TECHNIQUE: Multiplanar, multisequence MR imaging of the lumbar spine was performed. No intravenous contrast was administered.  COMPARISON: 12/08/2014  FINDINGS: Segmentation: Standard.  Alignment: Physiologic.  Vertebrae: No fracture, evidence of discitis, or bone lesion.  Conus medullaris and cauda equina: Conus extends to the T12-L1 level. Conus and cauda equina appear normal.  Paraspinal and other soft tissues: No paraspinal abnormality.  Disc levels:  Disc spaces: Degenerative disc disease with disc height loss at L5-S1. Reactive endplate changes are noted.  T12-L1: No  significant disc bulge. No evidence of neural foraminal stenosis. No central canal stenosis.  L1-L2: No significant disc bulge. No evidence of neural foraminal stenosis. No central  canal stenosis.  L2-L3: Mild broad-based disc bulge. Mild bilateral facet arthropathy. No evidence of neural foraminal stenosis. No central canal stenosis.  L3-L4: Mild broad-based disc bulge. Mild bilateral facet arthropathy. No evidence of neural foraminal stenosis. No central canal stenosis.  L4-L5: Mild broad-based disc bulge. Mild bilateral facet arthropathy. Bilateral facet arthropathy. No evidence of neural foraminal stenosis. No central canal stenosis.  L5-S1: Broad-based disc bulge with a small right paracentral disc protrusion contacting the right intraspinal S1 nerve root. Mild bilateral foraminal narrowing. No central canal stenosis.  IMPRESSION: 1. At L5-S1 there is a broad-based disc bulge with a small right paracentral disc protrusion contacting the right intraspinal S1 nerve root. Mild bilateral foraminal narrowing. 2. At L4-5 there is a mild broad-based disc bulge. Mild bilateral facet arthropathy. Bilateral facet arthropathy.   Electronically Signed By: Elige Ko On: 11/28/2017 10:53     Objective:  VS:  HT:    WT:   BMI:     BP:127/71  HR:61bpm  TEMP:98 F (36.7 C)(Oral)  RESP:  Physical Exam  Ortho Exam Imaging: Xr C-arm No Report  Result Date: 03/08/2019 Please see Notes tab for imaging impression.

## 2019-08-14 IMAGING — CT CT RENAL STONE PROTOCOL
2 of 4 series · 17 of 46 positions shown, 19 images · non-contrast
Comparison: None.

CLINICAL DATA: Right-sided flank pain radiating to hip for past 3
days.

EXAM:
CT ABDOMEN AND PELVIS WITHOUT CONTRAST
TECHNIQUE: Multidetector CT imaging of the abdomen and pelvis was performed
following the standard protocol without IV contrast.

[Series 3: stone study 5.0 i30f 2 · axial · 0.95mm/px · z∈[+867,+1297]mm · 14 of 96 slices shown, 16 images]
[im 5/96  soft-tissue]
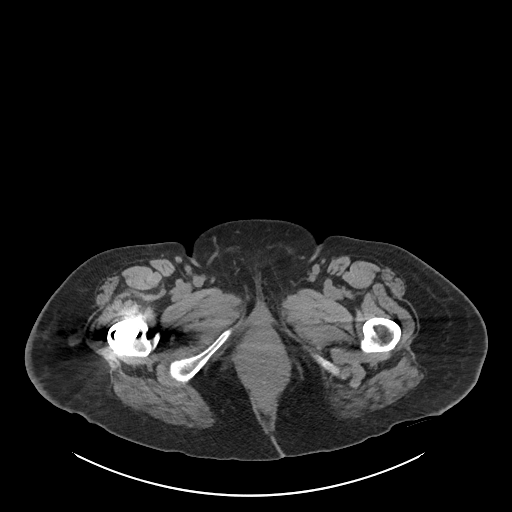
[im 5/96  bone]
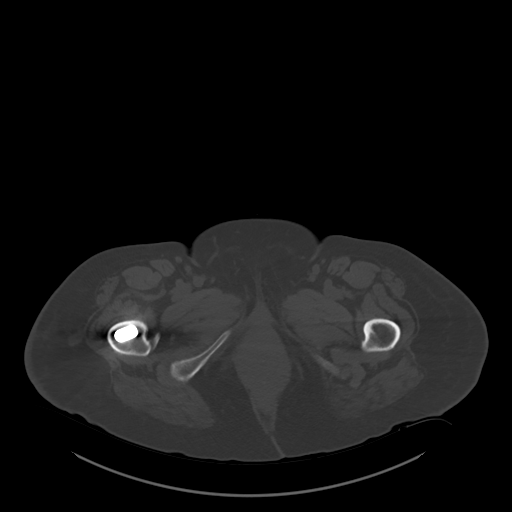
[im 13/96  soft-tissue]
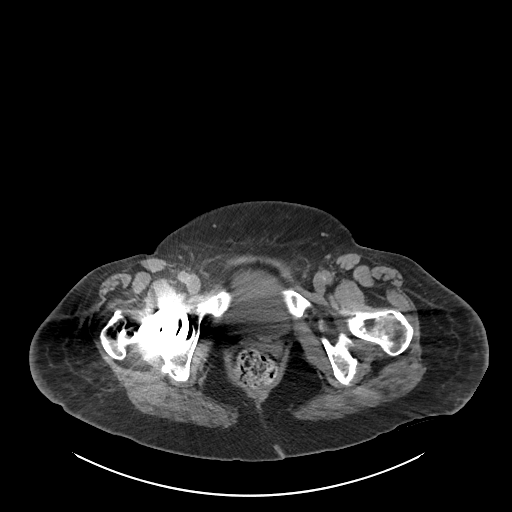
[im 17/96  soft-tissue]
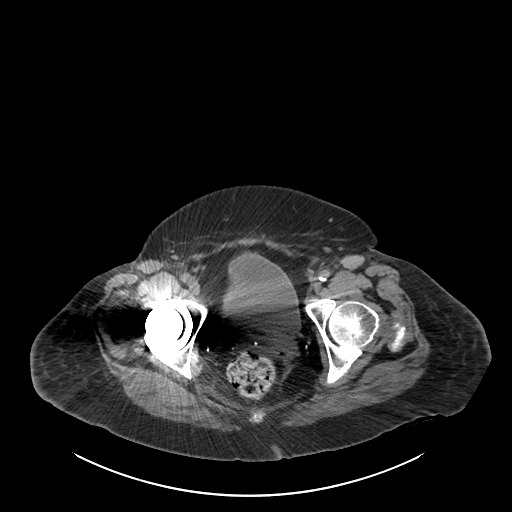
[im 25/96  soft-tissue]
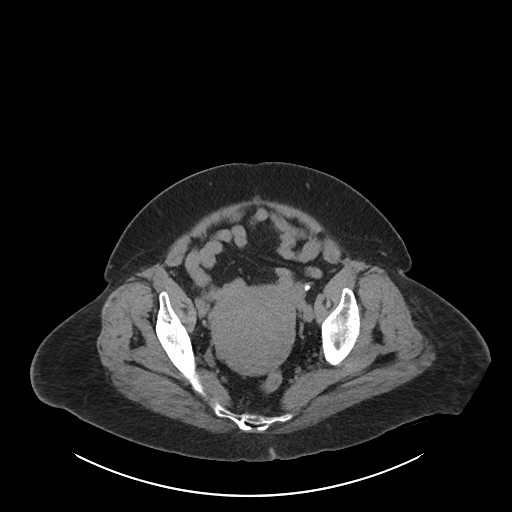
[im 34/96  soft-tissue]
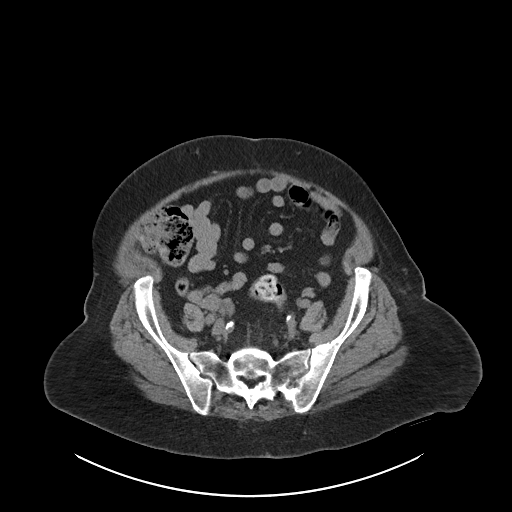
[im 38/96  soft-tissue]
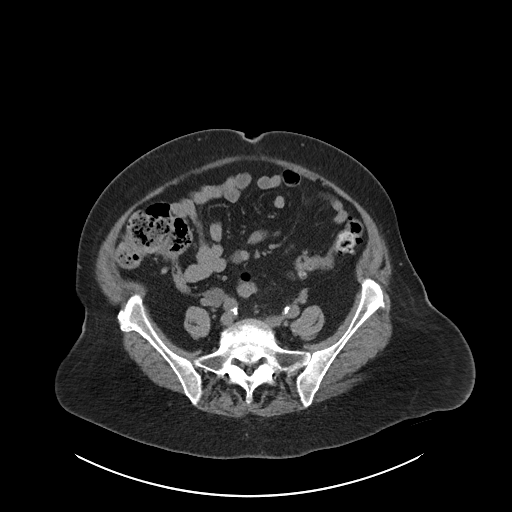
[im 46/96  soft-tissue]
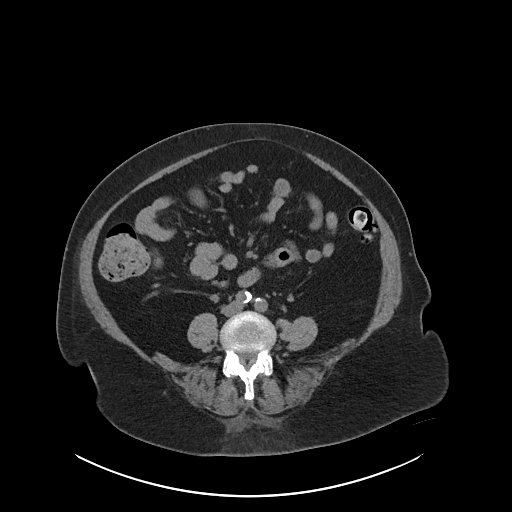
[im 50/96  soft-tissue]
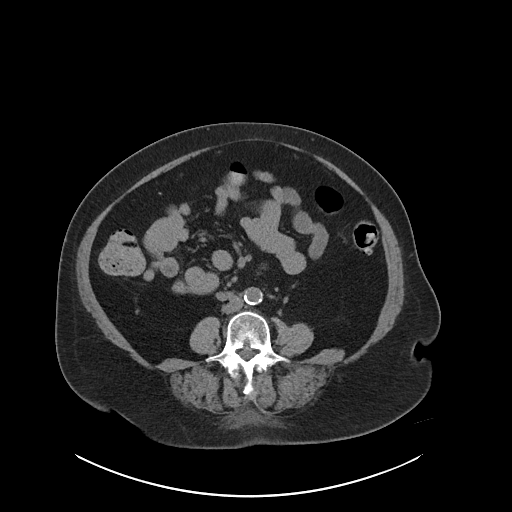
[im 58/96  soft-tissue]
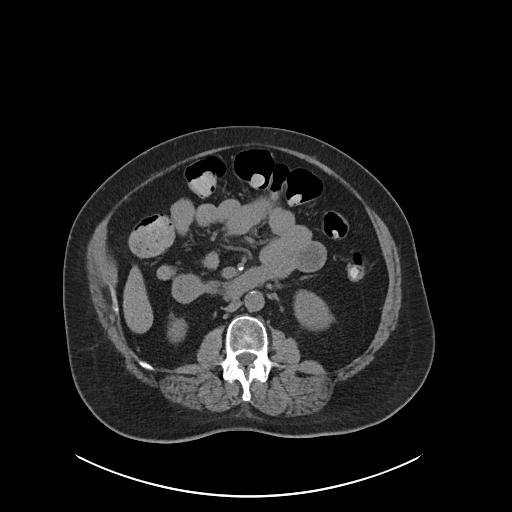
[im 58/96  bone]
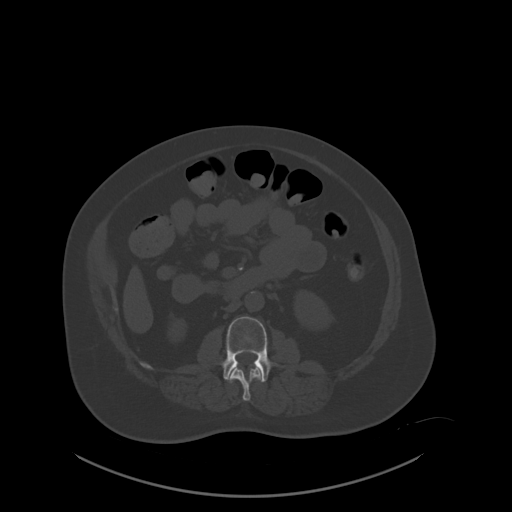
[im 62/96  soft-tissue]
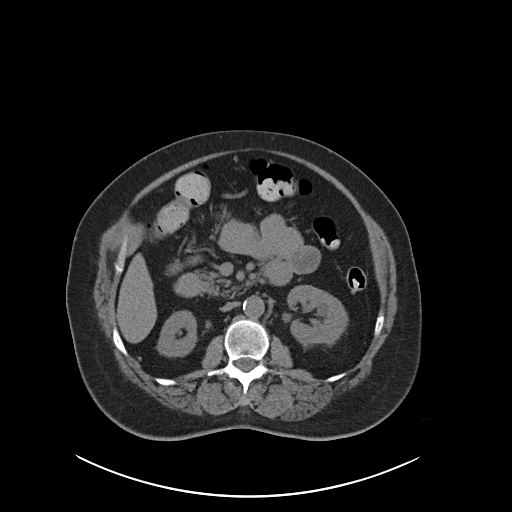
[im 71/96  soft-tissue]
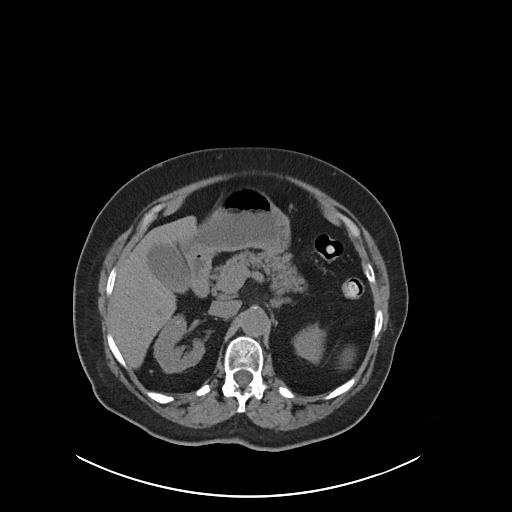
[im 79/96  soft-tissue]
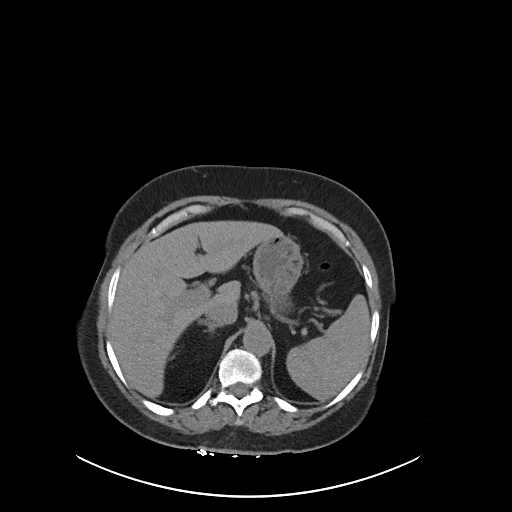
[im 83/96  soft-tissue]
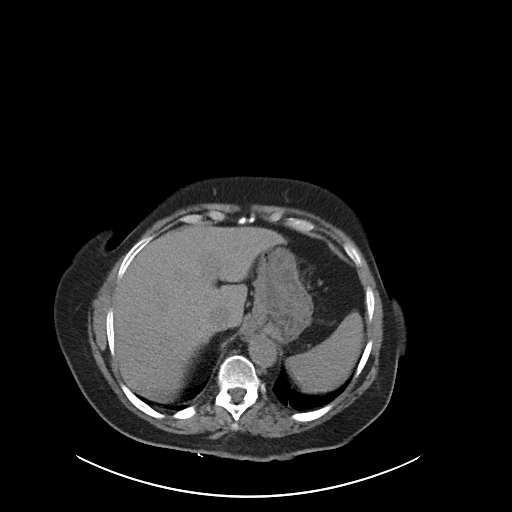
[im 91/96  soft-tissue]
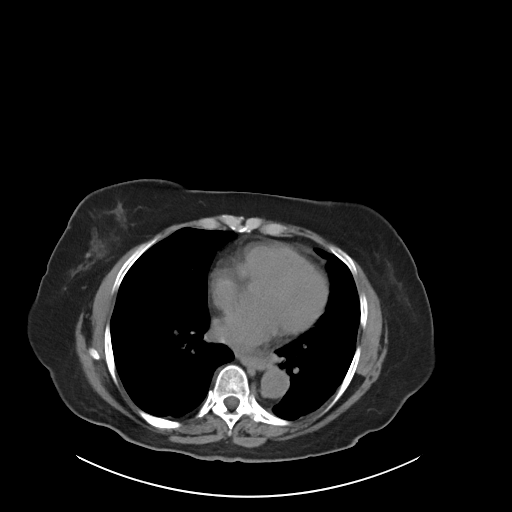

[Series 6: coronal soft tissue · coronal · 0.94mm/px · 3 of 102 slices shown]
[im 34/102  soft-tissue]
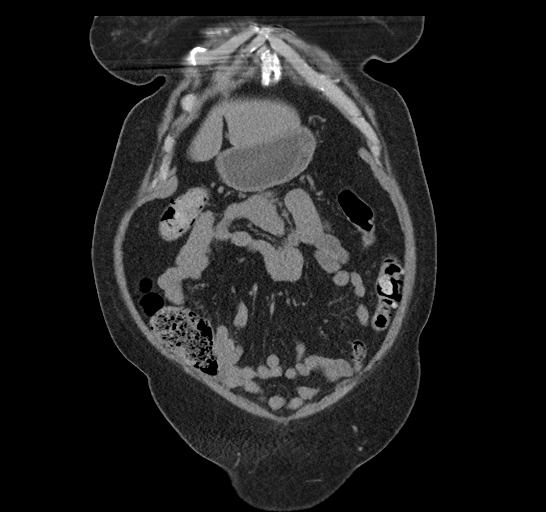
[im 45/102  soft-tissue]
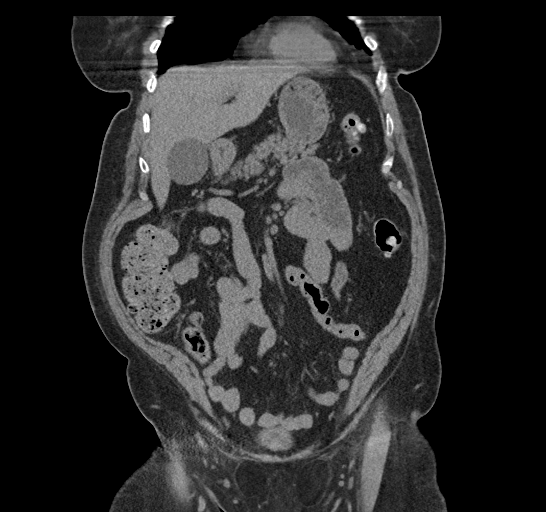
[im 57/102  soft-tissue]
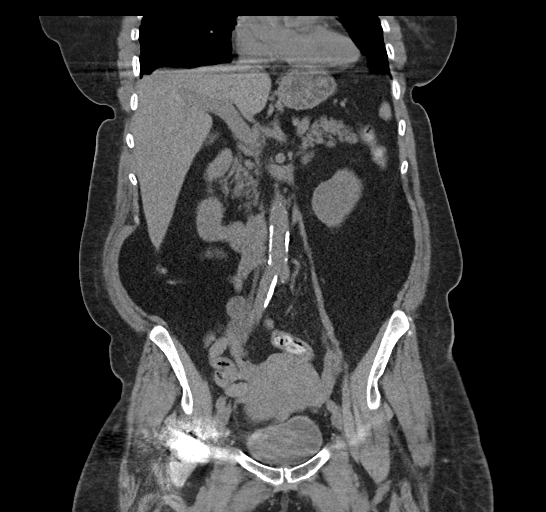

[17 of 46 positions shown; findings below may reference images not displayed]

FINDINGS: Lower chest: No acute findings.

Hepatobiliary: No mass visualized on this unenhanced exam.
Gallbladder is unremarkable.

Pancreas: No mass or inflammatory process visualized on this
unenhanced exam.

Spleen:  Within normal limits in size.

Adrenals/Urinary tract: No evidence of urolithiasis or
hydronephrosis. Unremarkable unopacified urinary bladder.

Stomach/Bowel: Tiny hiatal hernia. No evidence of obstruction,
inflammatory process, or abnormal fluid collections. Normal appendix
visualized. Diverticulosis is seen predominately involving the
descending colon, however there is no evidence of acute
diverticulitis.

Vascular/Lymphatic: No pathologically enlarged lymph nodes
identified. No evidence of abdominal aortic aneurysm. Aortic
atherosclerosis.

Reproductive: Enlarged uterus with lobulated contour, consistent
with uterine fibroids. No adnexal mass or free fluid identified.

Other:  Small bilateral inguinal hernias containing only fat.

Musculoskeletal: No suspicious bone lesions identified. Bipolar
right hip prosthesis in expected position. Moderate to severe
degenerative disc disease at L5-S1.
IMPRESSION: No evidence of urolithiasis, hydronephrosis, or other acute
findings.

Colonic diverticulosis, without radiographic evidence of
diverticulitis.

Tiny hiatal hernia.

Uterine fibroids.

Small bilateral fat-containing inguinal hernias.

## 2023-03-13 ENCOUNTER — Encounter: Payer: Self-pay | Admitting: *Deleted

## 2023-03-13 ENCOUNTER — Encounter: Payer: Self-pay | Admitting: Cardiology

## 2023-03-13 ENCOUNTER — Ambulatory Visit: Payer: Medicare Other | Attending: Cardiology | Admitting: Cardiology

## 2023-03-13 VITALS — BP 142/86 | HR 73 | Ht 67.0 in | Wt 180.2 lb

## 2023-03-13 DIAGNOSIS — R0789 Other chest pain: Secondary | ICD-10-CM | POA: Diagnosis not present

## 2023-03-13 DIAGNOSIS — R0602 Shortness of breath: Secondary | ICD-10-CM | POA: Diagnosis not present

## 2023-03-13 NOTE — Patient Instructions (Signed)
Medication Instructions:  Aspirin removed from list today Continue all other medications.     Labwork: none  Testing/Procedures: Your physician has requested that you have an echocardiogram. Echocardiography is a painless test that uses sound waves to create images of your heart. It provides your doctor with information about the size and shape of your heart and how well your heart's chambers and valves are working. This procedure takes approximately one hour. There are no restrictions for this procedure. Please do NOT wear cologne, perfume, aftershave, or lotions (deodorant is allowed). Please arrive 15 minutes prior to your appointment time. Office will contact with results via phone, letter or mychart.     Follow-Up: Pending test results   Any Other Special Instructions Will Be Listed Below (If Applicable).   If you need a refill on your cardiac medications before your next appointment, please call your pharmacy.

## 2023-03-13 NOTE — Progress Notes (Signed)
Clinical Summary Anita Lewis is a 77 y.o.female seen as a new consult, referred by Dr Quillian Quince for the following medical problems.     1.Chest pain - ER visit 03/04/23 with chest pain - chest pain, cough, ear pain.  - pain was 9 hrs prior to ER eval, trops were negative  -pcp started ppi   - seen in 2020 by [facility] cards for chest pain - "Her chest pain is atypical in that it occurs at rest, seems better with PPI, and has been going on for a month without worsening" 06/2019 echo: LVEF 60-65%, grade I dd 06/2019 GXT negative  - intermittent pain since. Mild pain. Not positional. Can last all day long, though most commonly in evening. No related to eating. Just started antacid yesterday.  - some SOB with activities since chest pain started.  - some recent edema - cannot run on treadmil due to back pains  CAD risk factors: DM2, HTN, HL, former smoker x 10 years Past Medical History:  Diagnosis Date   Arthritis    Diabetes mellitus, type II (HCC)    GERD (gastroesophageal reflux disease)    Gout    Headache    Hypertension      Allergies  Allergen Reactions   No Known Allergies      Current Outpatient Medications  Medication Sig Dispense Refill   acetaminophen (TYLENOL) 500 MG tablet Take 1,000 mg by mouth every 6 (six) hours as needed (FOR PAIN.).     aspirin 325 MG EC tablet Take 1 tablet (325 mg total) by mouth daily. 30 tablet 0   Cholecalciferol (VITAMIN D3) 400 units tablet Take 400 Units by mouth daily.     lisinopril-hydrochlorothiazide (PRINZIDE,ZESTORETIC) 10-12.5 MG tablet Take 1 tablet by mouth daily.     megestrol (MEGACE) 40 MG tablet      metFORMIN (GLUCOPHAGE) 500 MG tablet TAKE ONE TABLET BY MOUTH TWICE DAILY 180 tablet 0   metoprolol tartrate (LOPRESSOR) 25 MG tablet Take 12.5 mg by mouth 2 (two) times daily. MORNING & AFTERNOON.     omeprazole (PRILOSEC) 20 MG capsule Take 20 mg by mouth daily as needed (for acid reflux/indigestion).     simvastatin  (ZOCOR) 20 MG tablet Take 20 mg by mouth at bedtime.     No current facility-administered medications for this visit.     Past Surgical History:  Procedure Laterality Date   CATARACT EXTRACTION W/PHACO Right 07/31/2018   Procedure: CATARACT EXTRACTION PHACO AND INTRAOCULAR LENS PLACEMENT (IOC);  Surgeon: Baruch Goldmann, MD;  Location: [facility];  Service: Ophthalmology;  Laterality: Right;  CDE: 8.39   CATARACT EXTRACTION W/PHACO Left 09/18/2018   Procedure: CATARACT EXTRACTION PHACO AND INTRAOCULAR LENS PLACEMENT (IOC);  Surgeon: Baruch Goldmann, MD;  Location: [facility];  Service: Ophthalmology;  Laterality: Left;  CDE: 7.60   NO PAST SURGERIES     TOTAL HIP ARTHROPLASTY Right 02/17/2017   Procedure: TOTAL HIP ARTHROPLASTY ANTERIOR APPROACH;  Surgeon: Marybelle Killings, MD;  Location: [facility];  Service: Orthopedics;  Laterality: Right;   TUBAL LIGATION       Allergies  Allergen Reactions   No Known Allergies       Family History  Problem Relation Age of Onset   Cancer Mother        lung   Hypertension Mother    Hypertension Father      Social History Anita Lewis reports that she quit smoking about 24 years ago. Her smoking use included  cigarettes. She has a 2.50 pack-year smoking history. She has never used smokeless tobacco. Anita Lewis reports no history of alcohol use.   Review of Systems CONSTITUTIONAL: No weight loss, fever, chills, weakness or fatigue.  HEENT: Eyes: No visual loss, blurred vision, double vision or yellow sclerae.No hearing loss, sneezing, congestion, runny nose or sore throat.  SKIN: No rash or itching.  CARDIOVASCULAR: per hpi RESPIRATORY: No shortness of breath, cough or sputum.  GASTROINTESTINAL: No anorexia, nausea, vomiting or diarrhea. No abdominal pain or blood.  GENITOURINARY: No burning on urination, no polyuria NEUROLOGICAL: No headache, dizziness, syncope, paralysis, ataxia, numbness or tingling in the extremities. No change in bowel or bladder control.   MUSCULOSKELETAL: No muscle, back pain, joint pain or stiffness.  LYMPHATICS: No enlarged nodes. No history of splenectomy.  PSYCHIATRIC: No history of depression or anxiety.  ENDOCRINOLOGIC: No reports of sweating, cold or heat intolerance. No polyuria or polydipsia.  Marland Kitchen   Physical Examination Today's Vitals   03/13/23 1332  BP: (!) 142/86  Pulse: 73  SpO2: 94%  Weight: 180 lb 3.2 oz (81.7 kg)  Height: '5\' 7"'$  (1.702 m)   Body mass index is 28.22 kg/m.  Gen: resting comfortably, no acute distress HEENT: no scleral icterus, pupils equal round and reactive, no palptable cervical adenopathy,  CV: RRR, no mrg, no jvd Resp: Clear to auscultation bilaterally GI: abdomen is soft, non-tender, non-distended, normal bowel sounds, no hepatosplenomegaly MSK: extremities are warm, no edema.  Skin: warm, no rash Neuro:  no focal deficits Psych: appropriate affect     Assessment and Plan  Chest pain -atypical symptoms but multiple CAD risk factors including DM2, certaintly risk for atypical angina. Her coinciding recent DOE is notabel as well - plan for echo, pending results consider ischemic testing likely a lexiscan. Cannot run on treadmill due to back pains.      Arnoldo Lenis, M.D.,

## 2023-04-08 ENCOUNTER — Ambulatory Visit: Payer: Medicare Other | Attending: Cardiology

## 2023-04-08 DIAGNOSIS — R0602 Shortness of breath: Secondary | ICD-10-CM | POA: Diagnosis not present

## 2023-04-09 LAB — ECHOCARDIOGRAM COMPLETE
AR max vel: 1.53 cm2
AV Area VTI: 1.75 cm2
AV Area mean vel: 1.66 cm2
AV Mean grad: 4 mmHg
AV Peak grad: 7.5 mmHg
Ao pk vel: 1.37 m/s
Area-P 1/2: 2.46 cm2
Calc EF: 61 %
MV M vel: 4.34 m/s
MV Peak grad: 75.3 mmHg
S' Lateral: 2.5 cm
Single Plane A2C EF: 61.8 %
Single Plane A4C EF: 61.1 %

## 2023-04-25 ENCOUNTER — Telehealth: Payer: Self-pay | Admitting: *Deleted

## 2023-04-25 DIAGNOSIS — R079 Chest pain, unspecified: Secondary | ICD-10-CM

## 2023-04-25 NOTE — Telephone Encounter (Signed)
-----   Message from Antoine Poche, MD sent at 04/19/2023  8:40 AM EDT ----- Normal echo, can she get a lexiscan for chest pain please   J BrancH MD

## 2023-04-25 NOTE — Telephone Encounter (Signed)
Lesle Chris, LPN 04/07/8118  1:47 PM EDT Back to Top    Notified, copy to pcp.  Will enter order & send to pcc for scheduling.

## 2023-04-29 ENCOUNTER — Encounter: Payer: Self-pay | Admitting: *Deleted

## 2023-04-29 ENCOUNTER — Telehealth: Payer: Self-pay | Admitting: Cardiology

## 2023-04-29 NOTE — Telephone Encounter (Signed)
Checking percert on the following patient for testing scheduled at [facility].   LEXISCAN - 05/05/2023

## 2023-05-05 ENCOUNTER — Encounter (HOSPITAL_COMMUNITY)
Admission: RE | Admit: 2023-05-05 | Discharge: 2023-05-05 | Disposition: A | Discharge status: Home / Self Care | Payer: Medicare Other | Source: Ambulatory Visit | Attending: Cardiology | Admitting: Cardiology

## 2023-05-05 ENCOUNTER — Ambulatory Visit (HOSPITAL_COMMUNITY)
Admission: RE | Admit: 2023-05-05 | Discharge: 2023-05-05 | Disposition: A | Discharge status: Home / Self Care | Payer: Medicare Other | Source: Ambulatory Visit | Attending: Cardiology | Admitting: Cardiology

## 2023-05-05 DIAGNOSIS — R079 Chest pain, unspecified: Secondary | ICD-10-CM | POA: Insufficient documentation

## 2023-05-05 LAB — NM MYOCAR MULTI W/SPECT W/WALL MOTION / EF
LV dias vol: 50 mL (ref 46–106)
LV sys vol: 7 mL
Nuc Stress EF: 86 %
Peak HR: 96 {beats}/min
RATE: 0.3
Rest HR: 66 {beats}/min
Rest Nuclear Isotope Dose: 10.2 mCi
SDS: 1
SRS: 5
SSS: 6
ST Depression (mm): 0 mm
Stress Nuclear Isotope Dose: 31.2 mCi
TID: 0.82

## 2023-05-05 MED ORDER — REGADENOSON 0.4 MG/5ML IV SOLN
INTRAVENOUS | Status: AC
  Administered 2023-05-05: 0.4 mg via INTRAVENOUS
  Filled 2023-05-05: qty 5

## 2023-05-05 MED ORDER — TECHNETIUM TC 99M TETROFOSMIN IV KIT
10.2000 | PACK | Freq: Once | INTRAVENOUS | Status: AC | PRN
  Administered 2023-05-05: 10.2 via INTRAVENOUS

## 2023-05-05 MED ORDER — TECHNETIUM TC 99M TETROFOSMIN IV KIT
31.2000 | PACK | Freq: Once | INTRAVENOUS | Status: AC | PRN
  Administered 2023-05-05: 31.2 via INTRAVENOUS

## 2023-05-05 MED ORDER — SODIUM CHLORIDE FLUSH 0.9 % IV SOLN
INTRAVENOUS | Status: AC
  Administered 2023-05-05: 10 mL via INTRAVENOUS
  Filled 2023-05-05: qty 10

## 2023-06-09 ENCOUNTER — Telehealth: Payer: Self-pay | Admitting: *Deleted

## 2023-06-09 NOTE — Telephone Encounter (Signed)
-----   Message from Antoine Poche, MD sent at 06/02/2023  9:13 AM EDT ----- Normal stress test, no evidence of any significant blockages in the heart. Needs to f/u with pcp to consider noncardiac causes of her symptoms.Can f/u with [place] just as needed  Dominga Ferry MD

## 2023-06-09 NOTE — Telephone Encounter (Signed)
Lesle Chris, LPN 0/98/1191  4:78 PM EDT Back to Top    Notified, copy to pcp.

## 2024-09-06 DIAGNOSIS — E1122 Type 2 diabetes mellitus with diabetic chronic kidney disease: Secondary | ICD-10-CM | POA: Diagnosis not present

## 2024-09-06 DIAGNOSIS — Z7984 Long term (current) use of oral hypoglycemic drugs: Secondary | ICD-10-CM | POA: Diagnosis not present

## 2024-09-06 DIAGNOSIS — I129 Hypertensive chronic kidney disease with stage 1 through stage 4 chronic kidney disease, or unspecified chronic kidney disease: Secondary | ICD-10-CM | POA: Diagnosis not present

## 2024-09-06 DIAGNOSIS — D649 Anemia, unspecified: Secondary | ICD-10-CM | POA: Insufficient documentation

## 2024-09-06 DIAGNOSIS — R7989 Other specified abnormal findings of blood chemistry: Secondary | ICD-10-CM | POA: Diagnosis not present

## 2024-09-06 DIAGNOSIS — N189 Chronic kidney disease, unspecified: Secondary | ICD-10-CM | POA: Insufficient documentation

## 2024-09-06 DIAGNOSIS — D72819 Decreased white blood cell count, unspecified: Secondary | ICD-10-CM | POA: Insufficient documentation

## 2024-09-06 DIAGNOSIS — R519 Headache, unspecified: Secondary | ICD-10-CM | POA: Diagnosis present

## 2024-09-06 DIAGNOSIS — Z87891 Personal history of nicotine dependence: Secondary | ICD-10-CM | POA: Insufficient documentation

## 2024-09-06 DIAGNOSIS — R112 Nausea with vomiting, unspecified: Secondary | ICD-10-CM | POA: Diagnosis not present

## 2024-09-07 ENCOUNTER — Emergency Department (HOSPITAL_COMMUNITY)
Admission: EM | Admit: 2024-09-07 | Discharge: 2024-09-07 | Disposition: A | Discharge status: Home / Self Care | Attending: Emergency Medicine | Admitting: Emergency Medicine

## 2024-09-07 ENCOUNTER — Encounter (HOSPITAL_COMMUNITY): Payer: Self-pay

## 2024-09-07 ENCOUNTER — Emergency Department (HOSPITAL_COMMUNITY)

## 2024-09-07 ENCOUNTER — Other Ambulatory Visit: Payer: Self-pay

## 2024-09-07 DIAGNOSIS — R519 Headache, unspecified: Secondary | ICD-10-CM

## 2024-09-07 DIAGNOSIS — R112 Nausea with vomiting, unspecified: Secondary | ICD-10-CM

## 2024-09-07 DIAGNOSIS — D649 Anemia, unspecified: Secondary | ICD-10-CM

## 2024-09-07 DIAGNOSIS — D72819 Decreased white blood cell count, unspecified: Secondary | ICD-10-CM

## 2024-09-07 DIAGNOSIS — R7989 Other specified abnormal findings of blood chemistry: Secondary | ICD-10-CM

## 2024-09-07 LAB — CBC WITH DIFFERENTIAL/PLATELET
Abs Immature Granulocytes: 0.03 K/uL (ref 0.00–0.07)
Basophils Absolute: 0 K/uL (ref 0.0–0.1)
Basophils Relative: 0 %
Eosinophils Absolute: 0.4 K/uL (ref 0.0–0.5)
Eosinophils Relative: 10 %
HCT: 30.4 % — ABNORMAL LOW (ref 36.0–46.0)
Hemoglobin: 9.4 g/dL — ABNORMAL LOW (ref 12.0–15.0)
Immature Granulocytes: 1 %
Lymphocytes Relative: 21 %
Lymphs Abs: 0.8 K/uL (ref 0.7–4.0)
MCH: 27.3 pg (ref 26.0–34.0)
MCHC: 30.9 g/dL (ref 30.0–36.0)
MCV: 88.4 fL (ref 80.0–100.0)
Monocytes Absolute: 0.7 K/uL (ref 0.1–1.0)
Monocytes Relative: 17 %
Neutro Abs: 2 K/uL (ref 1.7–7.7)
Neutrophils Relative %: 51 %
Platelets: 223 K/uL (ref 150–400)
RBC: 3.44 MIL/uL — ABNORMAL LOW (ref 3.87–5.11)
RDW: 15.4 % (ref 11.5–15.5)
WBC: 3.9 K/uL — ABNORMAL LOW (ref 4.0–10.5)
nRBC: 0 % (ref 0.0–0.2)

## 2024-09-07 LAB — URINALYSIS, ROUTINE W REFLEX MICROSCOPIC
Bacteria, UA: NONE SEEN
Bilirubin Urine: NEGATIVE
Glucose, UA: NEGATIVE mg/dL
Ketones, ur: NEGATIVE mg/dL
Leukocytes,Ua: NEGATIVE
Nitrite: NEGATIVE
Protein, ur: NEGATIVE mg/dL
Specific Gravity, Urine: 1.011 (ref 1.005–1.030)
pH: 5 (ref 5.0–8.0)

## 2024-09-07 LAB — TSH: TSH: 1.046 u[IU]/mL (ref 0.350–4.500)

## 2024-09-07 LAB — COMPREHENSIVE METABOLIC PANEL WITH GFR
ALT: 6 U/L (ref 0–44)
AST: 20 U/L (ref 15–41)
Albumin: 3.6 g/dL (ref 3.5–5.0)
Alkaline Phosphatase: 48 U/L (ref 38–126)
Anion gap: 11 (ref 5–15)
BUN: 52 mg/dL — ABNORMAL HIGH (ref 8–23)
CO2: 21 mmol/L — ABNORMAL LOW (ref 22–32)
Calcium: 9.6 mg/dL (ref 8.9–10.3)
Chloride: 107 mmol/L (ref 98–111)
Creatinine, Ser: 3.99 mg/dL — ABNORMAL HIGH (ref 0.44–1.00)
GFR, Estimated: 11 mL/min — ABNORMAL LOW (ref >60)
Glucose, Bld: 95 mg/dL (ref 70–99)
Potassium: 4.9 mmol/L (ref 3.5–5.1)
Sodium: 139 mmol/L (ref 135–145)
Total Bilirubin: 0.3 mg/dL (ref 0.0–1.2)
Total Protein: 6.6 g/dL (ref 6.5–8.1)

## 2024-09-07 LAB — T4, FREE: Free T4: 2.64 ng/dL — ABNORMAL HIGH (ref 0.61–1.12)

## 2024-09-07 MED ORDER — ACETAMINOPHEN 500 MG PO TABS
1000.0000 mg | ORAL_TABLET | Freq: Once | ORAL | Status: AC
  Administered 2024-09-07: 1000 mg via ORAL
  Filled 2024-09-07: qty 2

## 2024-09-07 MED ORDER — PANTOPRAZOLE SODIUM 40 MG PO TBEC: 40.0000 mg | DELAYED_RELEASE_TABLET | Freq: Two times a day (BID) | ORAL | 0 refills | Status: AC

## 2024-09-07 MED ORDER — FAMOTIDINE 20 MG PO TABS: 20.0000 mg | ORAL_TABLET | Freq: Two times a day (BID) | ORAL | 0 refills | Status: AC

## 2024-09-07 NOTE — ED Provider Notes (Signed)
 Emergency Department Provider Note  TRIAGE NOTE: Pt to ED from home with c/o headache that started today, located at base of neck, also c/o dizziness and ongoing abd pain with weight loss. Pt says she has been to her Dr several times for these issues with weight loss and abdominal problems, but headache/dizziness started tonight.   HISTORY  Chief Complaint Headache (Abd pain ongoing issue)   HPI Anita Lewis is a 78 y.o. female with   a chief complaint of a headache and neck pain, which started today. The headache is localized to the back of the head and neck, described as a sensation of impending fall with ambulation. The patient denies any falls. The headache onset coincides with recent medication changes initiated two days ago by Dr. Toribio, who advised discontinuation of Metformin  and Lisinopril due to kidney concerns. The patient has a history of diabetes, hypertension, and recent significant weight loss, having lost approximately 14 pounds since May, attributed to persistent vomiting and diarrhea. A CT scan of the abdomen was previously performed, revealing an enlarged uterus but no acute abdominal pathology. The patient reports decreased urine output, though the urine is not dark. She has been advised to increase fluid intake. The patient took Bayer Aspirin  for the headache prior to arrival. History was obtained from both the patient and her daughter.  PMH Past Medical History:  Diagnosis Date   Arthritis    Chronic kidney disease    Diabetes mellitus, type II (HCC)    GERD (gastroesophageal reflux disease)    Gout    Headache    Hyperlipidemia    Hypertension     Home Medications Prior to Admission medications   Medication Sig Start Date End Date Taking? Authorizing Provider  famotidine  (PEPCID ) 20 MG tablet Take 1 tablet (20 mg total) by mouth 2 (two) times daily for 14 days. 09/07/24 09/21/24 Yes Dashun Borre, Selinda, MD  acetaminophen  (TYLENOL ) 500 MG tablet Take 1,000 mg by  mouth every 6 (six) hours as needed (FOR PAIN.).    [provider]  allopurinol (ZYLOPRIM) 100 MG tablet Take 100 mg by mouth daily. 12/29/22   [provider]  Cholecalciferol (VITAMIN D3) 400 units tablet Take 400 Units by mouth daily.    [provider]  glimepiride (AMARYL) 4 MG tablet Take 4 mg by mouth daily.    [provider]  [employer] BLOOD GLUCOSE TEST test strip by Other route once for 1 dose. Accucheck guide me strips- check sugars daily Dx E11.9 02/25/20   [provider]  Lancet Devices (SIMPLE DIAGNOSTICS LANCING DEV) MISC Apply topically. 12/31/22   [provider]  lisinopril-hydrochlorothiazide (PRINZIDE,ZESTORETIC) 10-12.5 MG tablet Take 1 tablet by mouth daily.    [provider]  metFORMIN  (GLUCOPHAGE ) 500 MG tablet TAKE ONE TABLET BY MOUTH TWICE DAILY 02/16/16   Nida, Gebreselassie W, MD  metoprolol  tartrate (LOPRESSOR ) 25 MG tablet Take 12.5 mg by mouth 2 (two) times daily. MORNING & AFTERNOON.    [provider]  ofloxacin (FLOXIN) 0.3 % OTIC solution SMARTSIG:In Ear(s) 03/04/23   [provider]  omeprazole (PRILOSEC) 20 MG capsule Take 20 mg by mouth daily as needed (for acid reflux/indigestion).    [provider]  pantoprazole  (PROTONIX ) 40 MG tablet Take 1 tablet (40 mg total) by mouth 2 (two) times daily. 09/07/24   Albirta Rhinehart, Selinda, MD  Pharmacist Choice Lancets MISC  12/31/22   [provider]  simvastatin (ZOCOR) 20 MG tablet Take 20 mg by  mouth at bedtime.    [provider]    Social History Social History   Tobacco Use   Smoking status: Former    Current packs/day: 0.00    Average packs/day: 1 pack/day for 10.0 years (10.0 ttl pk-yrs)    Types: Cigarettes    Start date: 07/24/1988    Quit date: 07/24/1998    Years since quitting: 26.1   Smokeless tobacco: Never  Vaping Use   Vaping status: Never Used  Substance Use Topics   Alcohol use: No   Drug use: No     Review of Systems: Documented in HPI ____________________________________________  PHYSICAL EXAM: VITAL SIGNS: Triage: Blood pressure 112/66, pulse 93, temperature 98.2 F (36.8 C), temperature source Oral, resp. rate 18, height 5' 7 (1.702 m), weight 81.7 kg, SpO2 97%.  Vitals:   09/07/24 0100 09/07/24 0200 09/07/24 0230 09/07/24 0300  BP: 129/73 123/71 112/66 112/66  Pulse: 92 97 97 93  Resp: 17 17 17 18   Temp:      TempSrc:      SpO2: 94% 99% 98% 97%  Weight:      Height:        Physical Exam Vitals and nursing note reviewed.  Constitutional:      Appearance: She is well-developed.  HENT:     Head: Normocephalic and atraumatic.  Cardiovascular:     Rate and Rhythm: Normal rate and regular rhythm.  Pulmonary:     Effort: No respiratory distress.     Breath sounds: No stridor.  Abdominal:     General: There is no distension.  Musculoskeletal:     Cervical back: Normal range of motion.  Neurological:     Mental Status: She is alert.       ____________________________________________   LABS (all labs ordered are listed, but only abnormal results are displayed)  Labs Reviewed  CBC WITH DIFFERENTIAL/PLATELET - Abnormal; Notable for the following components:      Result Value   WBC 3.9 (*)    RBC 3.44 (*)    Hemoglobin 9.4 (*)    HCT 30.4 (*)    All other components within normal limits  COMPREHENSIVE METABOLIC PANEL WITH GFR - Abnormal; Notable for the following components:   CO2 21 (*)    BUN 52 (*)    Creatinine, Ser 3.99 (*)    GFR, Estimated 11 (*)    All other components within normal limits  URINALYSIS, ROUTINE W REFLEX MICROSCOPIC - Abnormal; Notable for the following components:   Color, Urine STRAW (*)    Hgb urine dipstick SMALL (*)    All other components within normal limits  TSH  T4, FREE   ____________________________________________  EKG   EKG Interpretation Date/Time:    Ventricular Rate:    PR Interval:    QRS Duration:     QT Interval:    QTC Calculation:   R Axis:      Text Interpretation:          ____________________________________________  RADIOLOGY  CT Head Wo Contrast Result Date: 09/07/2024 CLINICAL DATA:  Initial evaluation for acute headache. EXAM: CT HEAD WITHOUT CONTRAST TECHNIQUE: Contiguous axial images were obtained from the base of the skull through the vertex without intravenous contrast. RADIATION DOSE REDUCTION: This exam was performed according to the departmental dose-optimization program which includes automated exposure control, adjustment of the mA and/or kV according to patient size and/or use of iterative reconstruction technique. COMPARISON:  None Available. FINDINGS: Brain: Cerebral volume within normal  limits. No acute intracranial hemorrhage. No acute large vessel territory infarct. No mass lesion, midline shift or mass effect. No hydrocephalus or extra-axial fluid collection. Vascular: No abnormal hyperdense vessel. Calcified atherosclerosis present at the skull base. Skull: Scalp soft tissues demonstrate no acute finding. Calvarium intact. 1.1 cm lucent lesion at the left parietal calvarium noted, favored benign. Sinuses/Orbits: Visualized globes and orbital soft tissues within normal limits. Visualized paranasal sinuses are clear. Visualized mastoid air cells are clear as well. Other: None. IMPRESSION: Normal head CT.  No acute intracranial abnormality. Electronically Signed   By: Morene Hoard M.D.   On: 09/07/2024 01:28   ____________________________________________  PROCEDURES  Procedure(s) performed:   Procedures ____________________________________________  INITIAL IMPRESSION / ASSESSMENT AND PLAN     Initial DDx:       includes dehydration, malnutrition, medication side effects, chronic kidney disease exacerbation, and potential gastrointestinal issues such as ulcers or inflammation.  ED Course   Patient ultimately found to have creatinine of 3.99.  Most  recent in our system was in March and this is significantly elevated from that.  Her doctor already has a scheduled for an ultrasound I will put a referral for nephrology.  She is making urine, not uremic I do not see indication for admission at this time for this.  Will change her reflux medications around a little bit and have her see gastroenterology for consideration of EGD or colonoscopy or further testing for her vomiting and diarrhea.  Otherwise patient stable for discharge.  Headache improved.  CT head negative.      Images ordered viewed and obtained by myself. Agree with Radiology interpretation. Details in ED course.  Labs ordered reviewed by myself as detailed in ED course.  Consultations obtained/considered detailed in ED course.    FINAL IMPRESSION Final diagnoses:  Nonintractable headache, unspecified chronicity pattern, unspecified headache type  Elevated serum creatinine  Leukopenia, unspecified type  Anemia, unspecified type  Nausea and vomiting, unspecified vomiting type     Disposition A medical screening exam was performed and I feel the patient has had an appropriate workup for their chief complaint at this time and likelihood of emergent condition existing is low. They have been counseled on decision, DISCHARGE, follow up and which symptoms necessitate immediate return to the emergency department. They or their family verbally stated understanding and agreement with plan and discharged in stable condition.   ____________________________________________   NEW OUTPATIENT MEDICATIONS STARTED DURING THIS VISIT:  Discharge Medication List as of 09/07/2024  3:29 AM     START taking these medications   Details  famotidine  (PEPCID ) 20 MG tablet Take 1 tablet (20 mg total) by mouth 2 (two) times daily for 14 days., Starting Tue 09/07/2024, Until Tue 09/21/2024, Normal        Note:  This note was prepared with assistance of Dragon voice recognition software. Occasional  wrong-word or sound-a-like substitutions may have occurred due to the inherent limitations of voice recognition software.    Kelen Laura, Selinda, MD 09/07/24 [phone]

## 2024-09-07 NOTE — ED Notes (Signed)
Pt drinking water, aware of need for urine sample

## 2024-09-07 NOTE — ED Notes (Signed)
 ED Provider at bedside.

## 2024-09-07 NOTE — ED Triage Notes (Signed)
 Pt to ED from home with c/o headache that started today, located at base of neck, also c/o dizziness and ongoing abd pain with weight loss. Pt says she has been to her Dr several times for these issues with weight loss and abdominal problems, but headache/dizziness started tonight.
